# Patient Record
Sex: Male | Born: 1985 | Marital: Single
Health system: Midwestern US, Community
[De-identification: ages and names within clinical notes are randomized; demographics above are authoritative.]

## PROBLEM LIST (undated history)

## (undated) DIAGNOSIS — R569 Unspecified convulsions: Secondary | ICD-10-CM

## (undated) DIAGNOSIS — I10 Essential (primary) hypertension: Secondary | ICD-10-CM

## (undated) DIAGNOSIS — F419 Anxiety disorder, unspecified: Secondary | ICD-10-CM

## (undated) DIAGNOSIS — L308 Other specified dermatitis: Secondary | ICD-10-CM

## (undated) DIAGNOSIS — L732 Hidradenitis suppurativa: Secondary | ICD-10-CM

## (undated) DIAGNOSIS — E119 Type 2 diabetes mellitus without complications: Secondary | ICD-10-CM

## (undated) HISTORY — DX: Anxiety disorder, unspecified: F41.9

## (undated) HISTORY — PX: TONSILLECTOMY: SUR1361

---

## 2012-09-19 ENCOUNTER — Encounter (HOSPITAL_COMMUNITY): Payer: Self-pay | Admitting: *Deleted

## 2012-09-19 ENCOUNTER — Emergency Department (HOSPITAL_COMMUNITY)
Admission: EM | Admit: 2012-09-19 | Discharge: 2012-09-19 | Disposition: A | Discharge status: Home / Self Care | Payer: BC Managed Care – PPO | Source: Home / Self Care | Attending: Family Medicine | Admitting: Family Medicine

## 2012-09-19 DIAGNOSIS — S161XXA Strain of muscle, fascia and tendon at neck level, initial encounter: Secondary | ICD-10-CM

## 2012-09-19 DIAGNOSIS — S139XXA Sprain of joints and ligaments of unspecified parts of neck, initial encounter: Secondary | ICD-10-CM

## 2012-09-19 MED ORDER — KETOROLAC TROMETHAMINE 10 MG PO TABS: 10.0000 mg | ORAL_TABLET | Freq: Four times a day (QID) | ORAL | Status: DC | PRN

## 2012-09-19 MED ORDER — CYCLOBENZAPRINE HCL 5 MG PO TABS: 5.0000 mg | ORAL_TABLET | Freq: Three times a day (TID) | ORAL | Status: DC | PRN

## 2012-09-19 NOTE — ED Provider Notes (Signed)
History     CSN: [id number]  Arrival date & time 09/19/12  1656   First MD Initiated Contact with Patient 09/19/12 1721      No chief complaint on file.   (Consider location/radiation/quality/duration/timing/severity/associated sxs/prior treatment) Patient is a 27 y.o. male presenting with neck injury. The history is provided by the patient.  Neck Injury This is a new problem. The current episode started 2 days ago (pulling on neck in anger and felt pop and then spasm has developed.). The problem has been gradually worsening. Pertinent negatives include no chest pain and no abdominal pain. The symptoms are aggravated by twisting.    No past medical history on file.  No past surgical history on file.  No family history on file.  History  Substance Use Topics  . Smoking status: Not on file  . Smokeless tobacco: Not on file  . Alcohol Use: Not on file      Review of Systems  Constitutional: Negative.   HENT: Positive for neck pain and neck stiffness.   Cardiovascular: Negative for chest pain.  Gastrointestinal: Negative for abdominal pain.    Allergies  Review of patient's allergies indicates not on file.  Home Medications   Current Outpatient Rx  Name  Route  Sig  Dispense  Refill  . cyclobenzaprine (FLEXERIL) 5 MG tablet   Oral   Take 1 tablet (5 mg total) by mouth 3 (three) times daily as needed for muscle spasms.   30 tablet   0   . ketorolac (TORADOL) 10 MG tablet   Oral   Take 1 tablet (10 mg total) by mouth every 6 (six) hours as needed for pain.   20 tablet   0     BP 145/85  Pulse 64  Temp(Src) 98.8 F (37.1 C) (Oral)  Resp 16  SpO2 100%  Physical Exam  Nursing note and vitals reviewed. Constitutional: He is oriented to person, place, and time. He appears well-developed and well-nourished. He appears distressed.  HENT:  Head: Normocephalic and atraumatic.  Right Ear: External ear normal.  Left Ear: External ear normal.  Mouth/Throat:  Oropharynx is clear and moist.  Neck: Muscular tenderness present. No spinous process tenderness present. Rigidity present. Decreased range of motion present.  Musculoskeletal: He exhibits tenderness.       Cervical back: He exhibits decreased range of motion, tenderness, pain and spasm. He exhibits no bony tenderness, no swelling and no edema.  Nl upper and lower ext motor and sens fxn.  Lymphadenopathy:    He has no cervical adenopathy.  Neurological: He is alert and oriented to person, place, and time. He has normal reflexes. He displays normal reflexes. No cranial nerve deficit.  Skin: Skin is warm and dry.    ED Course  Procedures (including critical care time)  Labs Reviewed - No data to display No results found.   1. Cervical strain, acute, initial encounter       MDM          Linna Hoff, MD 09/19/12 [phone]

## 2012-09-19 NOTE — ED Notes (Signed)
States he was frustrated and had his hands behind his head. He pushed back with head and forward with his arms and felt something pull in his neck on Wednesday.  It felt like a strain.

## 2014-02-17 ENCOUNTER — Encounter: Payer: Self-pay | Admitting: Family Medicine

## 2014-12-16 ENCOUNTER — Emergency Department (HOSPITAL_COMMUNITY)
Admission: EM | Admit: 2014-12-16 | Discharge: 2014-12-16 | Disposition: A | Discharge status: Home / Self Care | Payer: Managed Care, Other (non HMO) | Attending: Emergency Medicine | Admitting: Emergency Medicine

## 2014-12-16 ENCOUNTER — Encounter (HOSPITAL_COMMUNITY): Payer: Self-pay

## 2014-12-16 ENCOUNTER — Emergency Department (HOSPITAL_COMMUNITY): Admission: EM | Admit: 2014-12-16 | Discharge: 2014-12-16 | Discharge status: Absent without leave | Payer: Self-pay

## 2014-12-16 DIAGNOSIS — Z87891 Personal history of nicotine dependence: Secondary | ICD-10-CM | POA: Diagnosis not present

## 2014-12-16 DIAGNOSIS — Z79899 Other long term (current) drug therapy: Secondary | ICD-10-CM | POA: Diagnosis not present

## 2014-12-16 DIAGNOSIS — R42 Dizziness and giddiness: Secondary | ICD-10-CM | POA: Diagnosis present

## 2014-12-16 DIAGNOSIS — Z791 Long term (current) use of non-steroidal anti-inflammatories (NSAID): Secondary | ICD-10-CM | POA: Insufficient documentation

## 2014-12-16 LAB — CBC WITH DIFFERENTIAL/PLATELET
BASOS ABS: 0 10*3/uL (ref 0.0–0.1)
Basophils Relative: 0 % (ref 0–1)
Eosinophils Absolute: 0.1 10*3/uL (ref 0.0–0.7)
Eosinophils Relative: 1 % (ref 0–5)
HCT: 39.9 % (ref 39.0–52.0)
Hemoglobin: 13.6 g/dL (ref 13.0–17.0)
LYMPHS ABS: 1.1 10*3/uL (ref 0.7–4.0)
LYMPHS PCT: 20 % (ref 12–46)
MCH: 29.8 pg (ref 26.0–34.0)
MCHC: 34.1 g/dL (ref 30.0–36.0)
MCV: 87.5 fL (ref 78.0–100.0)
Monocytes Absolute: 0.5 10*3/uL (ref 0.1–1.0)
Monocytes Relative: 9 % (ref 3–12)
NEUTROS ABS: 3.8 10*3/uL (ref 1.7–7.7)
NEUTROS PCT: 70 % (ref 43–77)
PLATELETS: 296 10*3/uL (ref 150–400)
RBC: 4.56 MIL/uL (ref 4.22–5.81)
RDW: 12.8 % (ref 11.5–15.5)
WBC: 5.4 10*3/uL (ref 4.0–10.5)

## 2014-12-16 LAB — URINALYSIS, ROUTINE W REFLEX MICROSCOPIC
Bilirubin Urine: NEGATIVE
GLUCOSE, UA: NEGATIVE mg/dL
HGB URINE DIPSTICK: NEGATIVE
Ketones, ur: NEGATIVE mg/dL
Leukocytes, UA: NEGATIVE
Nitrite: NEGATIVE
PH: 6.5 (ref 5.0–8.0)
Protein, ur: NEGATIVE mg/dL
SPECIFIC GRAVITY, URINE: 1.006 (ref 1.005–1.030)
UROBILINOGEN UA: 0.2 mg/dL (ref 0.0–1.0)

## 2014-12-16 LAB — BASIC METABOLIC PANEL
Anion gap: 4 — ABNORMAL LOW (ref 5–15)
BUN: 13 mg/dL (ref 6–20)
CALCIUM: 9.2 mg/dL (ref 8.9–10.3)
CO2: 25 mmol/L (ref 22–32)
Chloride: 106 mmol/L (ref 101–111)
Creatinine, Ser: 0.69 mg/dL (ref 0.61–1.24)
GFR calc Af Amer: >60 mL/min (ref >60)
GFR calc non Af Amer: >60 mL/min (ref >60)
GLUCOSE: 93 mg/dL (ref 65–99)
Potassium: 3.6 mmol/L (ref 3.5–5.1)
SODIUM: 135 mmol/L (ref 135–145)

## 2014-12-16 LAB — I-STAT TROPONIN, ED: TROPONIN I, POC: 0 ng/mL (ref 0.00–0.08)

## 2014-12-16 MED ORDER — SODIUM CHLORIDE 0.9 % IV BOLUS (SEPSIS)
1000.0000 mL | Freq: Once | INTRAVENOUS | Status: AC
Start: 2014-12-16 — End: 2014-12-16
  Administered 2014-12-16: 1000 mL via INTRAVENOUS

## 2014-12-16 MED ORDER — MECLIZINE HCL 25 MG PO TABS: 25.0000 mg | ORAL_TABLET | Freq: Three times a day (TID) | ORAL | Status: AC | PRN

## 2014-12-16 MED ORDER — MECLIZINE HCL 25 MG PO TABS
25.0000 mg | ORAL_TABLET | Freq: Once | ORAL | Status: AC
  Administered 2014-12-16: 25 mg via ORAL
  Filled 2014-12-16: qty 1

## 2014-12-16 NOTE — ED Notes (Signed)
MD at bedside. 

## 2014-12-16 NOTE — ED Provider Notes (Signed)
CSN: [id number]     Arrival date & time 12/16/14  1448 History   First MD Initiated Contact with Patient 12/16/14 1518     Chief Complaint  Patient presents with  . Dizziness     (Consider location/radiation/quality/duration/timing/severity/associated sxs/prior Treatment) Patient is a 29 y.o. male presenting with dizziness. The history is provided by the patient and medical records.  Dizziness   This is a 29 year old male with no significant past medical history presenting to the ED for dizziness.  Patient states he started a new medication, Pristiq, on Tuesday-- today was his 3rd dose, took at [facility] today which is slightly later than he has been taking it.  States he went home to eat lunch and took his wife some food and began feeling "off balance" and "in a fog".  Wife states he appeared more sluggish than normal and somewhat pale.  He states he went back to work but began feeling worse and was sent home.  He states he attempted to call his PCP but never got a call back.  He states he continues to feel somewhat "off".  States some dizziness, worse with moving his head and walking but better sitting still.  No hx of vertigo.  He denies any current chest pain or shortness of breath, states he did have some of this earlier in the week. He denies any headache, photophobia, confusion, changes in speech, numbness, weakness, or visual disturbance. He denies any recent illness, fevers, sweats, or chills. Patient does admit to recent increased stress-- he and wife are buying a new house and recently found out that they are expecting their first child. His work is also Audiological scientistchanging management.    History reviewed. No pertinent past medical history. Past Surgical History  Procedure Laterality Date  . Tonsillectomy     History reviewed. No pertinent family history. History  Substance Use Topics  . Smoking status: Former Smoker    Types: Cigarettes    Quit date: 04/07/2012  . Smokeless tobacco: Not on file   . Alcohol Use: 4.2 oz/week    7 Shots of liquor per week    Review of Systems  Neurological: Positive for dizziness.  All other systems reviewed and are negative.     Allergies  Review of patient's allergies indicates no known allergies.  Home Medications   Prior to Admission medications   Medication Sig Start Date End Date Taking? Authorizing Provider  Aspirin-Salicylamide-Caffeine (BC HEADACHE POWDER PO) Take by mouth.    Historical Provider, MD  cyclobenzaprine (FLEXERIL) 5 MG tablet Take 1 tablet (5 mg total) by mouth 3 (three) times daily as needed for muscle spasms. 09/19/12   Linna HoffJames D Kindl, MD  ibuprofen (ADVIL,MOTRIN) 200 MG tablet Take 800 mg by mouth every 6 (six) hours as needed for pain.    Historical Provider, MD  ketorolac (TORADOL) 10 MG tablet Take 1 tablet (10 mg total) by mouth every 6 (six) hours as needed for pain. 09/19/12   Linna HoffJames D Kindl, MD   BP 158/100 mmHg  Pulse 77  Temp(Src) 98.1 F (36.7 C) (Oral)  Resp 16  SpO2 100%   Physical Exam  Constitutional: He is oriented to person, place, and time. He appears well-developed and well-nourished. No distress.  HENT:  Head: Normocephalic and atraumatic.  Mouth/Throat: Oropharynx is clear and moist.  Eyes: Conjunctivae and EOM are normal. Pupils are equal, round, and reactive to light.  Neck: Normal range of motion. Neck supple.  Cardiovascular: Normal rate, regular rhythm and  normal heart sounds.   Pulmonary/Chest: Effort normal and breath sounds normal. No respiratory distress. He has no wheezes.  Abdominal: Soft. Bowel sounds are normal. There is no tenderness. There is no guarding.  Musculoskeletal: Normal range of motion.  Neurological: He is alert and oriented to person, place, and time.  AAOx3, answering questions appropriately; equal strength UE and LE bilaterally; CN grossly intact; moves all extremities appropriately without ataxia; no focal neuro deficits or facial asymmetry appreciated  Skin:  Skin is warm and dry. He is not diaphoretic.  Psychiatric: He has a normal mood and affect.  Nursing note and vitals reviewed.   ED Course  Procedures (including critical care time) Labs Review Labs Reviewed  BASIC METABOLIC PANEL - Abnormal; Notable for the following:    Anion gap 4 (*)    All other components within normal limits  URINALYSIS, ROUTINE W REFLEX MICROSCOPIC - Abnormal; Notable for the following:    APPearance CLOUDY (*)    All other components within normal limits  CBC WITH DIFFERENTIAL/PLATELET  I-STAT TROPOININ, ED    Imaging Review No results found.   EKG Interpretation   Date/Time:  Thursday Dec 16 2014 15:06:38 EDT Ventricular Rate:  75 PR Interval:  172 QRS Duration: 88 QT Interval:  376 QTC Calculation: 420 R Axis:   62 Text Interpretation:  Sinus rhythm Normal ECG No previous ECGs available  Confirmed by Bebe ShaggyWICKLINE  MD, Dorinda HillNALD ([id number]) on 12/16/2014 3:32:04 PM      MDM   Final diagnoses:  Dizziness    29 year old male here with dizziness  Onset today. He recently started Pristiq, today was his third dose. He also admits to significant increase in stress recently. Patient is afebrile and nontoxic in appearance on exam. His neurologic exam is non-focal. He does report that his symptoms are worse with movement and ambulation. He has no history of vertigo, however I suspect his symptoms are vertiginous in nature which may be secondary to new medications versus some stress and anxiety. His lab work is reassuring. EKG without acute ischemic changes. Patient was given IV fluids and meclizine with significant improvement of his symptoms. He remains neurologically intact. Patient will be discharged home and started to follow-up with his PCP to discuss medications and potential side effects.  Discussed plan with patient, he/she acknowledged understanding and agreed with plan of care.  Return precautions given for new or worsening symptoms.  Garlon HatchetLisa M Sanders,  PA-C 12/16/14 1736  Zadie Rhineonald Wickline, MD 12/17/14 [phone]

## 2014-12-16 NOTE — Discharge Instructions (Signed)
Take the prescribed medication as directed if needed for nausea.  Make sure to eat regularly and drink fluids. Follow-up with Dr. Cyndia BentBadger-- make sure to discuss Pristiq and side effects.  I have attached your labs on back for his review. Return to the ED for new or worsening symptoms.

## 2014-12-16 NOTE — ED Notes (Signed)
Pt has started new medication.  Pristiq.  Pt has taken 3 doses.  Tues, wed and 1 today.  Today he took med late at 4312.  Within 45 min pt states he felt "hazy", "tired". Pt called MD but did not get return call.  No n/v.  No chest pain.

## 2014-12-16 NOTE — ED Notes (Signed)
EKG completed in triage.

## 2017-02-27 ENCOUNTER — Encounter (HOSPITAL_COMMUNITY): Payer: Self-pay | Admitting: Emergency Medicine

## 2017-02-27 ENCOUNTER — Emergency Department (HOSPITAL_COMMUNITY): Payer: 59

## 2017-02-27 ENCOUNTER — Emergency Department (HOSPITAL_COMMUNITY)
Admission: EM | Admit: 2017-02-27 | Discharge: 2017-02-27 | Disposition: A | Discharge status: Home / Self Care | Payer: 59 | Attending: Physician Assistant | Admitting: Physician Assistant

## 2017-02-27 DIAGNOSIS — Z79899 Other long term (current) drug therapy: Secondary | ICD-10-CM | POA: Diagnosis not present

## 2017-02-27 DIAGNOSIS — Z87891 Personal history of nicotine dependence: Secondary | ICD-10-CM | POA: Insufficient documentation

## 2017-02-27 DIAGNOSIS — F13939 Sedative, hypnotic or anxiolytic use, unspecified with withdrawal, unspecified: Secondary | ICD-10-CM | POA: Insufficient documentation

## 2017-02-27 DIAGNOSIS — F13239 Sedative, hypnotic or anxiolytic dependence with withdrawal, unspecified: Secondary | ICD-10-CM

## 2017-02-27 DIAGNOSIS — R569 Unspecified convulsions: Secondary | ICD-10-CM | POA: Insufficient documentation

## 2017-02-27 LAB — CBG MONITORING, ED: GLUCOSE-CAPILLARY: 82 mg/dL (ref 65–99)

## 2017-02-27 LAB — BASIC METABOLIC PANEL
Anion gap: 11 (ref 5–15)
BUN: 14 mg/dL (ref 6–20)
CHLORIDE: 105 mmol/L (ref 101–111)
CO2: 21 mmol/L — ABNORMAL LOW (ref 22–32)
Calcium: 9.8 mg/dL (ref 8.9–10.3)
Creatinine, Ser: 0.94 mg/dL (ref 0.61–1.24)
Glucose, Bld: 87 mg/dL (ref 65–99)
Potassium: 5.2 mmol/L — ABNORMAL HIGH (ref 3.5–5.1)
SODIUM: 137 mmol/L (ref 135–145)

## 2017-02-27 LAB — CBC
HEMATOCRIT: 40.2 % (ref 39.0–52.0)
Hemoglobin: 13.7 g/dL (ref 13.0–17.0)
MCH: 29.6 pg (ref 26.0–34.0)
MCHC: 34.1 g/dL (ref 30.0–36.0)
MCV: 86.8 fL (ref 78.0–100.0)
PLATELETS: 330 10*3/uL (ref 150–400)
RBC: 4.63 MIL/uL (ref 4.22–5.81)
RDW: 13.2 % (ref 11.5–15.5)
WBC: 13.4 10*3/uL — AB (ref 4.0–10.5)

## 2017-02-27 MED ORDER — LORAZEPAM 2 MG/ML IJ SOLN
1.0000 mg | Freq: Once | INTRAMUSCULAR | Status: AC
  Administered 2017-02-27: 1 mg via INTRAVENOUS
  Filled 2017-02-27: qty 1

## 2017-02-27 MED ORDER — LORAZEPAM 1 MG PO TABS: ORAL_TABLET | ORAL | 0 refills | Status: AC

## 2017-02-27 MED ORDER — KETOROLAC TROMETHAMINE 30 MG/ML IJ SOLN
15.0000 mg | Freq: Once | INTRAMUSCULAR | Status: AC
  Administered 2017-02-27: 15 mg via INTRAMUSCULAR
  Filled 2017-02-27: qty 1

## 2017-02-27 NOTE — ED Notes (Signed)
Patient transported to CT 

## 2017-02-27 NOTE — ED Notes (Signed)
Pt states he has been out of his Xanax X2 days. States takes daily up to TID.
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
## 2017-02-27 NOTE — Discharge Instructions (Signed)
Please read instructions below. Begin taking the Ativan in a tapered fashion as follows:  - 2 mg every 6 hours, for 4 doses, then  - 1 mg every 6 hours, for 4 doses, then  - 1 mg every 12 hours, for 4 doses. It is important that you follow-up with your primary care provider about your visit today. I have provided a neurology referral as needed. Return to the ER for seizure-like activity, or new or concerning symptoms.
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
## 2017-03-04 ENCOUNTER — Ambulatory Visit (INDEPENDENT_AMBULATORY_CARE_PROVIDER_SITE_OTHER): Payer: 59 | Admitting: Neurology

## 2017-03-04 ENCOUNTER — Encounter: Payer: Self-pay | Admitting: Neurology

## 2017-03-04 VITALS — BP 150/87 | HR 88 | Ht 68.0 in | Wt 203.0 lb

## 2017-03-04 DIAGNOSIS — R569 Unspecified convulsions: Secondary | ICD-10-CM

## 2017-03-04 NOTE — Patient Instructions (Signed)
Remember to drink plenty of fluid, eat healthy meals and do not skip any meals. Try to eat protein with a every meal and eat a healthy snack such as fruit or nuts in between meals. Try to keep a regular sleep-wake schedule and try to exercise daily, particularly in the form of walking, 20-30 minutes a day, if you can.   As far as diagnostic testing: MRi brain, EEG in the office then an extended EEG  I would like to see you back in 4 months, sooner if we need to. Please call us with any interim questions, concerns, problems, updates or refill requests.   Our phone number is [phone]. We also have an after hours call service for urgent matters and there is a physician on-call for urgent questions. For any emergencies you know to call 911 or go to the nearest emergency room   Seizure, Adult A seizure is a sudden burst of abnormal electrical activity in the brain. The abnormal activity temporarily interrupts normal brain function, causing a person to experience any of the following:  Involuntary movements.  Changes in awareness or consciousness.  Uncontrollable shaking (convulsions).  Seizures usually last from 30 seconds to 2 minutes. They usually do not cause permanent brain damage unless they are prolonged. What can cause a seizure to happen? Seizures can happen for many reasons including:  A fever.  Low blood sugar.  A medicine.  An illnesses.  A brain injury.  Some people who have a seizure never have another one. People who have repeated seizures have a condition called epilepsy. What are the symptoms of a seizure? Symptoms of a seizure vary greatly from person to person. They include:  Convulsions.  Stiffening of the body.  Involuntary movements of the arms or legs.  Loss of consciousness.  Breathing problems.  Falling suddenly.  Confusion.  Head nodding.  Eye blinking or fluttering.  Lip smacking.  Drooling.  Rapid eye movements.  Grunting.  Loss  of bladder control and bowel control.  Staring.  Unresponsiveness.  Some people have symptoms right before a seizure happens (aura) and right after a seizure happens. Symptoms of an aura include:  Fear or anxiety.  Nausea.  Feeling like the room is spinning (vertigo).  A feeling of having seen or heard something before (deja vu).  Odd tastes or smells.  Changes in vision, such as seeing flashing lights or spots.  Symptoms that may follow a seizure include:  Confusion.  Sleepiness.  Headache.  Weakness of one side of the body.  Follow these instructions at home: Medicines   Take over-the-counter and prescription medicines only as told by your health care provider.  Avoid any substances that may prevent your medicine from working properly, such as alcohol. Activity  Do not drive, swim, or do any other activities that would be dangerous if you had another seizure. Wait until your health care provider approves.  If you live in the [place] check with your local DMV (department of motor vehicles) to find out about the local driving laws. Each state has specific rules about when you can legally return to driving.  Get enough rest. Lack of sleep can make seizures more likely to occur. Educating others Teach friends and family what to do if you have a seizure. They should:  Lay you on the ground to prevent a fall.  Cushion your head and body.  Loosen any tight clothing around your neck.  Turn you on your side. If vomiting occurs, this helps  keep your airway clear.  Stay with you until you recover.  Not hold you down. Holding you down will not stop the seizure.  Not put anything in your mouth.  Know whether or not you need emergency care.  General instructions  Contact your health care provider each time you have a seizure.  Avoid anything that has ever triggered a seizure for you.  Keep a seizure diary. Record what you remember about each seizure, especially  anything that might have triggered the seizure.  Keep all follow-up visits as told by your health care provider. This is important. Contact a health care provider if:  You have another seizure.  You have seizures more often.  Your seizure symptoms change.  You continue to have seizures with treatment.  You have symptoms of an infection or illness. They might increase your risk of having a seizure. Get help right away if:  You have a seizure: ? That lasts longer than 5 minutes. ? That is different than previous seizures. ? That leaves you unable to speak or use a part of your body. ? That makes it harder to breathe. ? After a head injury.  You have: ? Multiple seizures in a row. ? Confusion or a severe headache right after a seizure.  You are having seizures more often.  You do not wake up immediately after a seizure.  You injure yourself during a seizure. These symptoms may represent a serious problem that is an emergency. Do not wait to see if the symptoms will go away. Get medical help right away. Call your local emergency services (911 in the [place] Do not drive yourself to the hospital. This information is not intended to replace advice given to you by your health care provider. Make sure you discuss any questions you have with your health care provider. Document Released: 07/20/2000 Document Revised: 03/18/2016 Document Reviewed: 02/24/2016 Elsevier Interactive Patient Education  2017 [employer]

## 2017-03-04 NOTE — Progress Notes (Signed)
[facility]    Provider:  Dr Lucia GaskinsAhern Referring Provider: Eartha InchBadger, Michael C, MD Primary Care Physician:  Eartha InchBadger, Michael C, MD  CC:  Seizure-like activity  HPI:  Antonio Cardenas is a 31 y.o. male here as a referral from Dr. Cyndia BentBadger for seizure-like activity in the setting of possible benzodiazepine withdrawal and illness. He had gone to the doctor and diagnosed with strep pharyngitis. He had a sore throat, a little fever and then he was driving and then the next thing he knew he was in the ambulance. Bystanders saw the car veer to the side of the road. % days ago, wife here and also provides information as well. He called wife and he was still incoherent from the ambulance. He bit his tongue. No urination. He had a possible seizure 10 years ago, he was drinking and using drugs for two days and hadn't slept in 48 hours and he woke up on a stretcher. In between these 2 episodes no other seizure-like events. He is still tired. His tongue still hurts. He continues to have some memory changes. He was on Xanax and now on ativan now as a taper. He had ran out of xanax for a day or two. He had an entire workup 10 years ago, eegs, MRIs and all were negative. Aunt with epilepsy. No other focal neurologic deficits, associated symptoms, inciting events or modifiable factors.  Reviewed notes, labs and imaging from outside physicians, which showed:  Patient was seen in the emergency room 02/27/2017 for possible seizure activity in the setting of benzodiazepine withdrawal and illness. He has a history of anxiety and takes Xanax routinely and ran out of his Xanax 2 days prior. Also developed sore throat, body aches and subjective fevers and chills. He was diagnosed with viral pharyngitis. On the way home he felt hot and dizzy, started driving off to the side of the road and the next thing he knew he was waking up to an ambulance. Bystanders told EMS they had seen patient rolling slowly off the side  of the road and stopped, he was sweaty and shaking, he had tongue biting, and he was briefly confused and round. He had one possible seizure over 10 years ago while he was in vagus endorse alcohol and drug use during that time. Diagnosis was potential benzodiazepine withdrawal seizure. Normal neurologic exam. EKG was normal.  CT head showed No acute intracranial abnormalities including mass lesion or mass effect, hydrocephalus, extra-axial fluid collection, midline shift, hemorrhage, or acute infarction, large ischemic events (personally reviewed images)  BMP was unremarkable, CBC showed elevated white blood cells is 13.4.   Review of Systems: Patient complains of symptoms per HPI as well as the following symptoms: Fevers chills, fatigue, blurred vision, cough, snoring, feeling hot, feeling cold, spinning sensation, memory loss, confusion, headache, numbness, slurred speech, dizziness, seizures, depression, anxiety, disinterest and activities, decreased energy, sleepiness. Pertinent negatives and positives per HPI. All others negative.   Social History   Social History  . Marital status: Single    Spouse name: N/A  . Number of children: N/A  . Years of education: N/A   Occupational History  . Not on file.   Social History Main Topics  . Smoking status: Former Smoker    Types: Cigarettes    Quit date: 04/07/2012  . Smokeless tobacco: Current User  . Alcohol use 4.2 oz/week    7 Shots of liquor per week  . Drug use: No  . Sexual activity: Yes  Birth control/ protection: Condom   Other Topics Concern  . Not on file   Social History Narrative  . No narrative on file    History reviewed. No pertinent family history.  Past Medical History:  Diagnosis Date  . Anxiety     Past Surgical History:  Procedure Laterality Date  . TONSILLECTOMY      Current Outpatient Prescriptions  Medication Sig Dispense Refill  . ALPRAZolam (XANAX) 0.5 MG tablet Take 0.5 mg by mouth at  bedtime as needed for anxiety.    . Artificial Tear Ointment (DRY EYES OP) Place 1 drop into both eyes as needed (dry eye).    . Aspirin-Salicylamide-Caffeine (BC HEADACHE POWDER PO) Take 1 tablet by mouth daily as needed.     . cyclobenzaprine (FLEXERIL) 5 MG tablet Take 1 tablet (5 mg total) by mouth 3 (three) times daily as needed for muscle spasms. 30 tablet 0  . ibuprofen (ADVIL,MOTRIN) 200 MG tablet Take 800 mg by mouth every 6 (six) hours as needed for pain.    Marland Kitchen lisinopril (PRINIVIL,ZESTRIL) 20 MG tablet Take 20 mg by mouth daily.    Marland Kitchen LORazepam (ATIVAN) 1 MG tablet Take 2 mg every 6 hours for 4 doses, then  Take 1 mg every 6 hours for 4 doses, then  Take 1 mg every 12 hours for 4 doses 16 tablet 0  . meclizine (ANTIVERT) 25 MG tablet Take 1 tablet (25 mg total) by mouth 3 (three) times daily as needed for dizziness. 30 tablet 0  . Multiple Vitamin (MULTIVITAMIN) tablet Take 1 tablet by mouth daily.    . rosuvastatin (CRESTOR) 20 MG tablet Take 20 mg by mouth daily.    Marland Kitchen venlafaxine XR (EFFEXOR-XR) 75 MG 24 hr capsule Take 75 mg by mouth 3 (three) times daily.     No current facility-administered medications for this visit.     Allergies as of 03/04/2017  . (No Known Allergies)    Vitals: BP (!) 150/87   Pulse 88   Ht 5\' 8"  (1.727 m)   Wt 203 lb (92.1 kg)   BMI 30.87 kg/m  Last Weight:  Wt Readings from Last 1 Encounters:  03/04/17 203 lb (92.1 kg)   Last Height:   Ht Readings from Last 1 Encounters:  03/04/17 5\' 8"  (1.727 m)   Physical exam: Exam: Gen: NAD, conversant, well nourised, obese, well groomed                     CV: RRR, no MRG. No Carotid Bruits. No peripheral edema, warm, nontender Eyes: Conjunctivae clear without exudates or hemorrhage  Neuro: Detailed Neurologic Exam  Speech:    Speech is normal; fluent and spontaneous with normal comprehension.  Cognition:    The patient is oriented to person, place, and time;     recent and remote memory  intact;     language fluent;     normal attention, concentration,     fund of knowledge Cranial Nerves:    The pupils are equal, round, and reactive to light. The fundi are normal and spontaneous venous pulsations are present. Visual fields are full to finger confrontation. Extraocular movements are intact. Trigeminal sensation is intact and the muscles of mastication are normal. The face is symmetric. The palate elevates in the midline. Hearing intact. Voice is normal. Shoulder shrug is normal. The tongue has normal motion without fasciculations.   Coordination:    Normal finger to nose and heel to shin. Normal rapid  alternating movements.   Gait:    Heel-toe and tandem gait are normal.   Motor Observation:    No asymmetry, no atrophy, and no involuntary movements noted. Tone:    Normal muscle tone.    Posture:    Posture is normal. normal erect    Strength:    Strength is V/V in the upper and lower limbs.      Sensation: intact to LT     Reflex Exam:  DTR's:    Deep tendon reflexes in the upper and lower extremities are normal bilaterally.   Toes:    The toes are downgoing bilaterally.   Clonus:    Clonus is absent.       Assessment/Plan:  31 year old male with a past medical history of anxiety and depression here for seizure in the setting of illness as well as possible benzodiazepine withdrawal. Need a full seizure workup including MRI of the brain with and without contrast, routine EEG and then extended EEG if clinically warranted. Is is likely a provoked seizure but need to rule out underlying seizure disorder.  Discussed: Patient is unable to drive, operate heavy machinery, perform activities at heights or participate in water activities until 6 months seizure free.  Discussed Patients with epilepsy have a small risk of sudden unexpected death, a condition referred to as sudden unexpected death in epilepsy (SUDEP). SUDEP is defined specifically as the sudden,  unexpected, witnessed or unwitnessed, nontraumatic and nondrowning death in patients with epilepsy with or without evidence for a seizure, and excluding documented status epilepticus, in which post mortem examination does not reveal a structural or toxicologic cause for death   Cc: Dr. Adonis HugueninBadger  Antonia Ahern, MD  [facility] [address] [place], [place] [id number]  Phone [phone] Fax [phone]

## 2017-03-06 ENCOUNTER — Emergency Department (HOSPITAL_COMMUNITY): Payer: 59

## 2017-03-06 ENCOUNTER — Encounter (HOSPITAL_COMMUNITY): Payer: Self-pay

## 2017-03-06 ENCOUNTER — Emergency Department (HOSPITAL_COMMUNITY)
Admission: EM | Admit: 2017-03-06 | Discharge: 2017-03-07 | Disposition: A | Discharge status: Home / Self Care | Payer: 59 | Attending: Emergency Medicine | Admitting: Emergency Medicine

## 2017-03-06 DIAGNOSIS — I10 Essential (primary) hypertension: Secondary | ICD-10-CM | POA: Diagnosis not present

## 2017-03-06 DIAGNOSIS — Z87891 Personal history of nicotine dependence: Secondary | ICD-10-CM | POA: Diagnosis not present

## 2017-03-06 DIAGNOSIS — Z79899 Other long term (current) drug therapy: Secondary | ICD-10-CM | POA: Diagnosis not present

## 2017-03-06 DIAGNOSIS — F13930 Sedative, hypnotic or anxiolytic use, unspecified with withdrawal, uncomplicated: Secondary | ICD-10-CM

## 2017-03-06 DIAGNOSIS — F13230 Sedative, hypnotic or anxiolytic dependence with withdrawal, uncomplicated: Secondary | ICD-10-CM | POA: Insufficient documentation

## 2017-03-06 DIAGNOSIS — R569 Unspecified convulsions: Secondary | ICD-10-CM | POA: Insufficient documentation

## 2017-03-06 HISTORY — DX: Essential (primary) hypertension: I10

## 2017-03-06 HISTORY — DX: Unspecified convulsions: R56.9

## 2017-03-06 LAB — BASIC METABOLIC PANEL
ANION GAP: 8 (ref 5–15)
BUN: 15 mg/dL (ref 6–20)
CALCIUM: 8.6 mg/dL — AB (ref 8.9–10.3)
CO2: 21 mmol/L — ABNORMAL LOW (ref 22–32)
CREATININE: 0.78 mg/dL (ref 0.61–1.24)
Chloride: 109 mmol/L (ref 101–111)
GLUCOSE: 112 mg/dL — AB (ref 65–99)
Potassium: 3.8 mmol/L (ref 3.5–5.1)
Sodium: 138 mmol/L (ref 135–145)

## 2017-03-06 LAB — CBC WITH DIFFERENTIAL/PLATELET
BASOS ABS: 0 10*3/uL (ref 0.0–0.1)
BASOS PCT: 0 %
EOS PCT: 1 %
Eosinophils Absolute: 0.1 10*3/uL (ref 0.0–0.7)
HCT: 33.1 % — ABNORMAL LOW (ref 39.0–52.0)
Hemoglobin: 11.6 g/dL — ABNORMAL LOW (ref 13.0–17.0)
Lymphocytes Relative: 10 %
Lymphs Abs: 1.5 10*3/uL (ref 0.7–4.0)
MCH: 30.5 pg (ref 26.0–34.0)
MCHC: 35 g/dL (ref 30.0–36.0)
MCV: 87.1 fL (ref 78.0–100.0)
MONO ABS: 1.1 10*3/uL — AB (ref 0.1–1.0)
Monocytes Relative: 7 %
Neutro Abs: 12.3 10*3/uL — ABNORMAL HIGH (ref 1.7–7.7)
Neutrophils Relative %: 82 %
PLATELETS: 341 10*3/uL (ref 150–400)
RBC: 3.8 MIL/uL — ABNORMAL LOW (ref 4.22–5.81)
RDW: 13.1 % (ref 11.5–15.5)
WBC: 15 10*3/uL — ABNORMAL HIGH (ref 4.0–10.5)

## 2017-03-06 MED ORDER — LORAZEPAM 1 MG PO TABS: 1.0000 mg | ORAL_TABLET | ORAL | 0 refills | Status: AC

## 2017-03-06 MED ORDER — LORAZEPAM 2 MG/ML IJ SOLN
1.0000 mg | Freq: Once | INTRAMUSCULAR | Status: AC
  Administered 2017-03-06: 1 mg via INTRAVENOUS
  Filled 2017-03-06: qty 1

## 2017-03-06 MED ORDER — KETOROLAC TROMETHAMINE 30 MG/ML IJ SOLN
30.0000 mg | Freq: Once | INTRAMUSCULAR | Status: AC
  Administered 2017-03-06: 30 mg via INTRAVENOUS
  Filled 2017-03-06: qty 1

## 2017-03-06 MED ORDER — SODIUM CHLORIDE 0.9 % IV BOLUS (SEPSIS)
1000.0000 mL | Freq: Once | INTRAVENOUS | Status: AC
  Administered 2017-03-06: 1000 mL via INTRAVENOUS

## 2017-03-06 NOTE — ED Triage Notes (Signed)
Patient had seizure 45 minutes ago; seen here last seek for same. Does not take medication for seizure disorder.

## 2017-03-06 NOTE — ED Provider Notes (Signed)
[facility]-EMERGENCY DEPT Provider Note   CSN: [id number] Arrival date & time: 03/06/17  2119     History   Chief Complaint Chief Complaint  Patient presents with  . Seizures    HPI Antonio Cardenas is a 31 y.o. male.  HPI 31 year old male who presents with seizure. He has a history of hypertension and anxiety. Had a witnessed seizure for 45 seconds by his wife today while he was sitting on the couch. She states that he heard him cry out from the other room and came in and saw that his eyes were rolled backwards and he had generalized shaking. EMS was called and on their arrival he was postictal, for about 7 minutes. I he was seen in the emergency department July 25 for a seizure at that time he was taking Xanax routinely, and ran out of his Xanax. It was thought that maybe his seizures were due to benzodiazepine withdrawal. He was discharged with Ativan taper which she finished 2 days ago. States that he otherwise has felt as if he has been in his usual state of health. He did follow-up with the neurologist as an outpatient and scheduled for an upcoming MRI and EEG. State he felt weird today, but no nausea, vomiting, tremulousness. No focal numbness/weakness, vision or speech changes, or headaches.   Past Medical History:  Diagnosis Date  . Anxiety   . Hypertension   . Seizures (HCC)     There are no active problems to display for this patient.   Past Surgical History:  Procedure Laterality Date  . TONSILLECTOMY         Home Medications    Prior to Admission medications   Medication Sig Start Date End Date Taking? Authorizing Provider  ALPRAZolam Prudy Feeler(XANAX) 0.5 MG tablet Take 0.5 mg by mouth at bedtime as needed for anxiety.    [provider]  Artificial Tear Ointment (DRY EYES OP) Place 1 drop into both eyes as needed (dry eye).    [provider]  Aspirin-Salicylamide-Caffeine (BC HEADACHE POWDER PO) Take 1 tablet by mouth daily as needed.     [provider]  cyclobenzaprine (FLEXERIL) 5 MG tablet Take 1 tablet (5 mg total) by mouth 3 (three) times daily as needed for muscle spasms. 09/19/12   Linna HoffKindl, James D, MD  ibuprofen (ADVIL,MOTRIN) 200 MG tablet Take 800 mg by mouth every 6 (six) hours as needed for pain.    [provider]  lisinopril (PRINIVIL,ZESTRIL) 20 MG tablet Take 20 mg by mouth daily. 02/11/17   [provider]  LORazepam (ATIVAN) 1 MG tablet Take 2 mg every 6 hours for 4 doses, then  Take 1 mg every 6 hours for 4 doses, then  Take 1 mg every 12 hours for 4 doses 02/27/17   Russo, Swazil[place]Jordan N, PA-C  LORazepam (ATIVAN) 1 MG tablet Take 1 tablet (1 mg total) by mouth See admin instructions. Take 1 tablet by mouth four times a day for 7 days. Then take 1 mg by mouth three times a day for 7 days. Then take 1 mg by mouth two times a day for 7 days. Then take 0.5 mg two times a day for 7 days. Then take 0.5 mg once daily for 7 days 03/06/17   Lavera GuiseLiu, Darryle Dennie Duo, MD  meclizine (ANTIVERT) 25 MG tablet Take 1 tablet (25 mg total) by mouth 3 (three) times daily as needed for dizziness. 12/16/14   Garlon HatchetSanders, Lisa M, PA-C  Multiple Vitamin (MULTIVITAMIN) tablet Take  1 tablet by mouth daily.    [provider]  rosuvastatin (CRESTOR) 20 MG tablet Take 20 mg by mouth daily. 02/06/17   [provider]  venlafaxine XR (EFFEXOR-XR) 75 MG 24 hr capsule Take 75 mg by mouth 3 (three) times daily. 02/06/17   [provider]    Family History No family history on file.  Social History Social History  Substance Use Topics  . Smoking status: Former Smoker    Types: Cigarettes    Quit date: 04/07/2012  . Smokeless tobacco: Current User  . Alcohol use 4.2 oz/week    7 Shots of liquor per week     Allergies   Patient has no known allergies.   Review of Systems Review of Systems  Constitutional: Negative for fever.  Respiratory: Negative for cough and shortness of breath.   Cardiovascular: Negative for chest  pain.  Gastrointestinal: Negative for abdominal pain.  Neurological: Positive for seizures.  All other systems reviewed and are negative.    Physical Exam Updated Vital Signs BP (!) 136/91   Pulse 88   Temp 100.3 F (37.9 C)   Resp (!) 21   Ht 5\' 9"  (1.753 m)   Wt 90.7 kg (200 lb)   SpO2 98%   BMI 29.53 kg/m   Physical Exam Physical Exam  Nursing note and vitals reviewed. Constitutional: Well developed, well nourished, non-toxic, and in no acute distress Head: Normocephalic and atraumatic.  Mouth/Throat: Oropharynx is clear and moist.  Neck: Normal range of motion. Neck supple. no nuchal rigidity. Cardiovascular: Normal rate and regular rhythm.   Pulmonary/Chest: Effort normal and breath sounds normal.  Abdominal: Soft. There is no tenderness. There is no rebound and no guarding.  Musculoskeletal: Normal range of motion.  Skin: Skin is warm and dry.  Psychiatric: Cooperative Neurological:  Alert, oriented to person, place, time, and situation. Memory grossly in tact. Fluent speech. No dysarthria or aphasia.  Cranial nerves:  Pupils are symmetric, and reactive to light. EOMI without nystagmus. No gaze deviation. Facial muscles symmetric with activation. Sensation to light touch over face in tact bilaterally. Hearing grossly in tact. Palate elevates symmetrically. Head turn and shoulder shrug are intact. Tongue midline.  Reflexes defered.  Muscle bulk and tone normal. No pronator drift. Moves all extremities symmetrically. Sensation to light touch is in tact throughout in bilateral upper and lower extremities. Coordination reveals no dysmetria with finger to nose.    ED Treatments / Results  Labs (all labs ordered are listed, but only abnormal results are displayed) Labs Reviewed  CBC WITH DIFFERENTIAL/PLATELET - Abnormal; Notable for the following:       Result Value   WBC 15.0 (*)    RBC 3.80 (*)    Hemoglobin 11.6 (*)    HCT 33.1 (*)    Neutro Abs 12.3 (*)     Monocytes Absolute 1.1 (*)    All other components within normal limits  BASIC METABOLIC PANEL - Abnormal; Notable for the following:    CO2 21 (*)    Glucose, Bld 112 (*)    Calcium 8.6 (*)    All other components within normal limits    EKG  EKG Interpretation  Date/Time:  Wednesday March 06 2017 21:26:06 EDT Ventricular Rate:  93 PR Interval:    QRS Duration: 88 QT Interval:  351 QTC Calculation: 437 R Axis:   69 Text Interpretation:  Sinus rhythm no acute changes  Confirmed by Crista Curb [phone]) on 03/06/2017 9:57:26 PM  Radiology Dg Chest 2 View  Result Date: 03/06/2017 CLINICAL DATA:  31 year old male with seizures and upper back pain. Low-grade fever. EXAM: CHEST  2 VIEW COMPARISON:  None. FINDINGS: The heart size and mediastinal contours are within normal limits. Both lungs are clear. The visualized skeletal structures are unremarkable. IMPRESSION: No active cardiopulmonary disease. Electronically Signed   By: Elgie CollardArash  Radparvar M.D.   On: 03/06/2017 23:18    Procedures Procedures (including critical care time)  Medications Ordered in ED Medications  sodium chloride 0.9 % bolus 1,000 mL (0 mLs Intravenous Stopped 03/06/17 2305)  ketorolac (TORADOL) 30 MG/ML injection 30 mg (30 mg Intravenous Given 03/06/17 2148)  LORazepam (ATIVAN) injection 1 mg (1 mg Intravenous Given 03/06/17 2302)     Initial Impression / Assessment and Plan / ED Course  I have reviewed the triage vital signs and the nursing notes.  Pertinent labs & imaging results that were available during my care of the patient were reviewed by me and considered in my medical decision making (see chart for details).    Patient recently seen for seizure that was felt to be related to benzodiazepine withdrawal. Had recurrent seizure today, two days after finishing one week taper of ativan. Spoke with Dr. Amada JupiterKirkpatrick form neurology. Given he was on Xanax, he felt that seizure still from benzodiazepine withdrawal  and he needed a much longer taper to come off of the xanax. Recommended prescribing 1 month taper. Recommended close neurology follow-up.  Patient on my evaluation has mentation back to baseline. Grossly neuro in tact. He has low grade fever, but no infectious sympptoms.  He did have CT head one week prior that showed no intracranial processes. No headache, confusion, neuro complaints, nuchal rigidity. At this time do not suspect encephalitis or meningitis.   Patient to follow-up closely with neurologist. He has outpatient MRI and EEG scheduled within next 1-2 weeks. Strict return and follow-up instructions reviewed. He expressed understanding of all discharge instructions and felt comfortable with the plan of care.    Final Clinical Impressions(s) / ED Diagnoses   Final diagnoses:  Seizure (HCC)  Benzodiazepine withdrawal without complication [facility](HCC)    New Prescriptions Discharge Medication List as of 03/07/2017 12:02 AM    START taking these medications   Details  !! LORazepam (ATIVAN) 1 MG tablet Take 1 tablet (1 mg total) by mouth See admin instructions. Take 1 tablet by mouth four times a day for 7 days. Then take 1 mg by mouth three times a day for 7 days. Then take 1 mg by mouth two times a day for 7 days. Then take 0.5 mg two times a day for  7 days. Then take 0.5 mg once daily for 7 days, Starting Wed 03/06/2017, Print     !! - Potential duplicate medications found. Please discuss with provider.       Lavera GuiseLiu, Alexxia Stankiewicz Duo, MD 03/08/17 [phone]

## 2017-03-06 NOTE — Discharge Instructions (Signed)
Follow-up closely with your neurologist. You seizure may still be due to benzodiazepine withdrawal, and you are written a month taper. You may need longer one, but follow-up closely with your neurologist or PCP regarding this.  Return for worsening symptoms, including confusion, fevers, recurrent seizures, or any other symptoms concerning to you.

## 2017-03-07 ENCOUNTER — Telehealth: Payer: Self-pay | Admitting: Neurology

## 2017-03-07 ENCOUNTER — Telehealth: Payer: Self-pay | Admitting: *Deleted

## 2017-03-07 NOTE — Telephone Encounter (Signed)
Previous message @ 9:00am  Was not routed properly, this is a message for [employer] Jennifer

## 2017-03-07 NOTE — Telephone Encounter (Signed)
New pt seen in the office on 03/04/17 d/t ER visit on 02/27/17 d/t seizure-like activity in the setting of possible benzodiazepine withdrawal and illness. He has [facility] and [facility]. He returned to the ER yesterday evening again for seizure and back pain, EKG and CXR wnl. He was given IV Toradol and lorazepam and placed on a PO lorazepam taper upon discharge. CBC showed elevated WBC @ 15 (up from 13.4 at last ED visit and low RBC (3.8), hgb (11.6) and hct. (33.1).

## 2017-03-07 NOTE — Telephone Encounter (Signed)
Pt is requesting a call back, he states the hospital is placing him on about a 30 day taper on the LORazepam (ATIVAN) 1 MG tablet, pt wants a call back to know if Dr Lucia GaskinsAhern feels that is necessary.

## 2017-03-07 NOTE — Telephone Encounter (Signed)
Pharmacy called related to Rx: ATIVAN #64 .Marland Kitchen.Marland Kitchen.EDCM clarified with PHARM D COURTNEY to change Rx to: #74.

## 2017-03-08 NOTE — Telephone Encounter (Signed)
Tried calling back, mailbox is full. Will try again later or tomorrow

## 2017-03-10 ENCOUNTER — Other Ambulatory Visit: Payer: Self-pay | Admitting: Neurology

## 2017-03-10 MED ORDER — LEVETIRACETAM 500 MG PO TABS: 500.0000 mg | ORAL_TABLET | Freq: Two times a day (BID) | ORAL | 11 refills | Status: DC

## 2017-03-10 MED ORDER — CYCLOBENZAPRINE HCL 10 MG PO TABS: 10.0000 mg | ORAL_TABLET | Freq: Three times a day (TID) | ORAL | 3 refills | Status: DC | PRN

## 2017-03-10 NOTE — Telephone Encounter (Signed)
Spoke to patient, he thinks he had anothr seizure in his sleep last night. At this point it is 4 seizures. Starting keppra.

## 2017-03-13 ENCOUNTER — Ambulatory Visit (INDEPENDENT_AMBULATORY_CARE_PROVIDER_SITE_OTHER): Payer: 59 | Admitting: Neurology

## 2017-03-13 ENCOUNTER — Encounter: Payer: Self-pay | Admitting: Neurology

## 2017-03-13 ENCOUNTER — Ambulatory Visit (INDEPENDENT_AMBULATORY_CARE_PROVIDER_SITE_OTHER): Payer: 59

## 2017-03-13 DIAGNOSIS — R569 Unspecified convulsions: Secondary | ICD-10-CM

## 2017-03-13 MED ORDER — GADOPENTETATE DIMEGLUMINE 469.01 MG/ML IV SOLN: 20.0000 mL | Freq: Once | INTRAVENOUS | Status: AC | PRN

## 2017-03-13 NOTE — Procedures (Signed)
    History:  Antonio Cardenas is a 31 year old patient with a history of seizure-like activity associated with possible benzodiazepine withdrawal. The patient was confused following the event with tongue biting, but with no urinary incontinence. The patient may have had a possible seizure 10 years ago associated with drinking alcohol and using drugs. The patient is being evaluated for the this event.  This is a routine EEG. No skull defects are noted. Medications include Xanax, aspirin, Flexeril, lisinopril, Ativan, multivitamins, Crestor, and Effexor.   EEG classification: Normal awake  Description of the recording: The background rhythms of this recording consists of a fairly well modulated medium amplitude alpha rhythm of 10 Hz that is reactive to eye opening and closure. As the record progresses, the patient appears to remain in the waking state throughout the recording. Photic stimulation was performed, resulting in a bilateral and symmetric photic driving response. Hyperventilation was also performed, resulting in a minimal buildup of the background rhythm activities without significant slowing seen. At no time during the recording does there appear to be evidence of spike or spike wave discharges or evidence of focal slowing. EKG monitor shows no evidence of cardiac rhythm abnormalities with a heart rate of 78.  Impression: This is a normal EEG recording in the waking state. No evidence of ictal or interictal discharges are seen.

## 2017-03-19 ENCOUNTER — Telehealth: Payer: Self-pay | Admitting: Neurology

## 2017-03-19 NOTE — Telephone Encounter (Signed)
Antonio Cardenas, Antonia B, MD  Donnelly AngelicaHogan, Jennifer L, RN      MRI brain normal thanks         EEG is normal. I would like to have a 72 hour EEG completed with [facility], would you see if he is willing? We discussed at appt. thanks

## 2017-03-19 NOTE — Telephone Encounter (Signed)
Patient called office requesting MRI results, states results have been posted to his mychart account.  Please call

## 2017-03-19 NOTE — Telephone Encounter (Signed)
Called pt w/ normal MRI and EEG results. Asked that he call back if he'd like to proceed w/ 72 hr EEG.

## 2017-05-10 NOTE — Telephone Encounter (Signed)
ERROR

## 2017-05-24 ENCOUNTER — Telehealth: Payer: Self-pay

## 2017-05-24 MED ORDER — CYCLOBENZAPRINE HCL 10 MG PO TABS: 10.0000 mg | ORAL_TABLET | Freq: Three times a day (TID) | ORAL | 3 refills | Status: DC | PRN

## 2017-05-24 MED ORDER — LEVETIRACETAM 500 MG PO TABS: 500.0000 mg | ORAL_TABLET | Freq: Two times a day (BID) | ORAL | 3 refills | Status: AC

## 2017-05-24 NOTE — Telephone Encounter (Signed)
Refills sent to patients mail order pharmacy for 90 day supply.

## 2017-06-11 ENCOUNTER — Encounter: Payer: Self-pay | Admitting: Neurology

## 2017-06-12 ENCOUNTER — Encounter: Payer: Self-pay | Admitting: Neurology

## 2017-07-08 ENCOUNTER — Ambulatory Visit: Payer: 59 | Admitting: Neurology

## 2017-07-08 ENCOUNTER — Encounter: Payer: Self-pay | Admitting: Neurology

## 2017-07-08 VITALS — BP 127/80 | HR 82 | Ht 69.0 in | Wt 203.6 lb

## 2017-07-08 DIAGNOSIS — R569 Unspecified convulsions: Secondary | ICD-10-CM | POA: Diagnosis not present

## 2017-07-08 DIAGNOSIS — F19230 Other psychoactive substance dependence with withdrawal, uncomplicated: Secondary | ICD-10-CM

## 2017-07-08 DIAGNOSIS — F19930 Other psychoactive substance use, unspecified with withdrawal, uncomplicated: Secondary | ICD-10-CM

## 2017-07-08 NOTE — Progress Notes (Signed)
[facility]    Provider:  Dr Lucia Gaskins Referring Provider: Eartha Inch, MD Primary Care Physician:  Eartha Inch, MD  CC:  Seizure-like activity  Interval history 07/08/2017: Here for follow up of seizure-like activity in the setting of possible Benzo withdrawal. Having side effects to Keppra. No more events. Discussed the possibility of these being provoked wue to withdrawal. Had a long conversation, cannot and sure that he does not have an underlying seizure disorder however both seizures were provoked by illness, drugs or alcohol.  MRI of the brain and routine EEG were negative.  I do think patient should have a 72-hour EEG to complete his workup however he declines, I have tried to encourage him.  At this time they would like to titrate off of the Keppra.  Warned about breakthrough seizures.   MRI brain 03/2017: showed No acute intracranial abnormalities including mass lesion or mass effect, hydrocephalus, extra-axial fluid collection, midline shift, hemorrhage, or acute infarction, large ischemic events (personally reviewed images)  Routine EEG normal    HPI:  Antonio Cardenas is a 31 y.o. male here as a referral from Dr. Cyndia Bent for seizure-like activity in the setting of possible benzodiazepine withdrawal and illness. He had gone to the doctor and diagnosed with strep pharyngitis. He had a sore throat, a little fever and then he was driving and then the next thing he knew he was in the ambulance. Bystanders saw the car veer to the side of the road,, wife here and also provides information as well. He called wife and he was still incoherent from the ambulance. He bit his tongue. No urination. He had a possible seizure 10 years ago, he was drinking and using drugs for two days and hadn't slept in 48 hours and he woke up on a stretcher. In between these 2 episodes no other seizure-like events. He is still tired. His tongue still hurts. He continues to have some  memory changes. He was on Xanax and now on ativan now as a taper. He had ran out of xanax for a day or two. He had an entire workup 10 years ago, eegs, MRIs and all were negative. Aunt with epilepsy. No other focal neurologic deficits, associated symptoms, inciting events or modifiable factors.  Reviewed notes, labs and imaging from outside physicians, which showed:  Patient was seen in the emergency room 02/27/2017 for possible seizure activity in the setting of benzodiazepine withdrawal and illness. He has a history of anxiety and takes Xanax routinely and ran out of his Xanax 2 days prior. Also developed sore throat, body aches and subjective fevers and chills. He was diagnosed with viral pharyngitis. On the way home he felt hot and dizzy, started driving off to the side of the road and the next thing he knew he was waking up to an ambulance. Bystanders told EMS they had seen patient rolling slowly off the side of the road and stopped, he was sweaty and shaking, he had tongue biting, and he was briefly confused and round. He had one possible seizure over 10 years ago while he was in vagus endorse alcohol and drug use during that time. Diagnosis was potential benzodiazepine withdrawal seizure. Normal neurologic exam. EKG was normal.  CT head showed No acute intracranial abnormalities including mass lesion or mass effect, hydrocephalus, extra-axial fluid collection, midline shift, hemorrhage, or acute infarction, large ischemic events (personally reviewed images)  BMP was unremarkable, CBC showed elevated white blood cells is 13.4.   Review of  Systems: Patient complains of symptoms per HPI as well as the following symptoms: Fevers chills, fatigue, blurred vision, cough, snoring, feeling hot, feeling cold, spinning sensation, memory loss, confusion, headache, numbness, slurred speech, dizziness, seizures, depression, anxiety, disinterest and activities, decreased energy, sleepiness. Pertinent  negatives and positives per HPI. All others negative.     Social History   Socioeconomic History  . Marital status: Single    Spouse name: Not on file  . Number of children: Not on file  . Years of education: Not on file  . Highest education level: Not on file  Social Needs  . Financial resource strain: Not on file  . Food insecurity - worry: Not on file  . Food insecurity - inability: Not on file  . Transportation needs - medical: Not on file  . Transportation needs - non-medical: Not on file  Occupational History  . Not on file  Tobacco Use  . Smoking status: Former Smoker    Types: Cigarettes    Last attempt to quit: 04/07/2012    Years since quitting: 5.2  . Smokeless tobacco: Current User  Substance and Sexual Activity  . Alcohol use: Yes    Alcohol/week: 4.2 oz    Types: 7 Shots of liquor per week  . Drug use: No  . Sexual activity: Yes    Birth control/protection: Condom  Other Topics Concern  . Not on file  Social History Narrative  . Not on file    No family history on file.  Past Medical History:  Diagnosis Date  . Anxiety   . Hypertension   . Seizures (HCC)     Past Surgical History:  Procedure Laterality Date  . TONSILLECTOMY      Current Outpatient Medications  Medication Sig Dispense Refill  . ALPRAZolam (XANAX) 0.5 MG tablet Take 0.5 mg by mouth at bedtime as needed for anxiety.    . Artificial Tear Ointment (DRY EYES OP) Place 1 drop into both eyes as needed (dry eye).    . Aspirin-Salicylamide-Caffeine (BC HEADACHE POWDER PO) Take 1 tablet by mouth daily as needed.     . cyclobenzaprine (FLEXERIL) 10 MG tablet Take 1 tablet (10 mg total) by mouth 3 (three) times daily as needed for muscle spasms. 90 tablet 3  . ibuprofen (ADVIL,MOTRIN) 200 MG tablet Take 800 mg by mouth every 6 (six) hours as needed for pain.    Marland Kitchen levETIRAcetam (KEPPRA) 500 MG tablet Take 1 tablet (500 mg total) by mouth 2 (two) times daily. 180 tablet 3  . lisinopril  (PRINIVIL,ZESTRIL) 20 MG tablet Take 20 mg by mouth daily.    Marland Kitchen LORazepam (ATIVAN) 1 MG tablet Take 2 mg every 6 hours for 4 doses, then  Take 1 mg every 6 hours for 4 doses, then  Take 1 mg every 12 hours for 4 doses 16 tablet 0  . LORazepam (ATIVAN) 1 MG tablet Take 1 tablet (1 mg total) by mouth See admin instructions. Take 1 tablet by mouth four times a day for 7 days. Then take 1 mg by mouth three times a day for 7 days. Then take 1 mg by mouth two times a day for 7 days. Then take 0.5 mg two times a day for 7 days. Then take 0.5 mg once daily for 7 days 64 tablet 0  . meclizine (ANTIVERT) 25 MG tablet Take 1 tablet (25 mg total) by mouth 3 (three) times daily as needed for dizziness. 30 tablet 0  . Multiple Vitamin (MULTIVITAMIN)  tablet Take 1 tablet by mouth daily.    . rosuvastatin (CRESTOR) 20 MG tablet Take 20 mg by mouth daily.    Marland Kitchen. venlafaxine XR (EFFEXOR-XR) 75 MG 24 hr capsule Take 75 mg by mouth 3 (three) times daily.     No current facility-administered medications for this visit.    Facility-Administered Medications Ordered in Other Visits  Medication Dose Route Frequency Provider Last Rate Last Dose  . gadopentetate dimeglumine (MAGNEVIST) injection 20 mL  20 mL Intravenous Once PRN Anson FretAhern, Antonia B, MD        Allergies as of 07/08/2017  . (No Known Allergies)    Vitals: BP 127/80   Pulse 82   Ht 5\' 9"  (1.753 m)   Wt 203 lb 9.6 oz (92.4 kg)   BMI 30.07 kg/m  Last Weight:  Wt Readings from Last 1 Encounters:  07/08/17 203 lb 9.6 oz (92.4 kg)   Last Height:   Ht Readings from Last 1 Encounters:  07/08/17 5\' 9"  (1.753 m)   Physical exam: Exam: Gen: NAD, conversant, well nourised, obese, well groomed                     CV: RRR, no MRG. No Carotid Bruits. No peripheral edema, warm, nontender Eyes: Conjunctivae clear without exudates or hemorrhage  Neuro: Detailed Neurologic Exam  Speech:    Speech is normal; fluent and spontaneous with normal comprehension.   Cognition:    The patient is oriented to person, place, and time;     recent and remote memory intact;     language fluent;     normal attention, concentration,     fund of knowledge Cranial Nerves:    The pupils are equal, round, and reactive to light. The fundi are normal and spontaneous venous pulsations are present. Visual fields are full to finger confrontation. Extraocular movements are intact. Trigeminal sensation is intact and the muscles of mastication are normal. The face is symmetric. The palate elevates in the midline. Hearing intact. Voice is normal. Shoulder shrug is normal. The tongue has normal motion without fasciculations.   Coordination:    Normal finger to nose and heel to shin. Normal rapid alternating movements.   Gait:    Heel-toe and tandem gait are normal.   Motor Observation:    No asymmetry, no atrophy, and no involuntary movements noted. Tone:    Normal muscle tone.    Posture:    Posture is normal. normal erect    Strength:    Strength is V/V in the upper and lower limbs.      Sensation: intact to LT     Reflex Exam:  DTR's:    Deep tendon reflexes in the upper and lower extremities are normal bilaterally.   Toes:    The toes are downgoing bilaterally.   Clonus:    Clonus is absent.      Assessment/Plan:  31 year old male with a past medical history of anxiety and depression here for seizure in the setting of illness as well as possible benzodiazepine withdrawal. Seizure 10 years ago in the setting of drugs and alcohol.  Full seizure workup negative including MRI of the brain with and without contrast, routine EEG. Likely a provoked seizure, discussed coming off of the Keppra but that cannot guarantee he will not have another seizure it is just more likely these were provoked. Will taper off of the Keppra. Discussed in detail with wife and patient. Reviewed MRI images as well and  EEG results. He declined a 72-hour EEG despite encouragement to  have it completed.  Discussed: Patient is unable to drive, operate heavy machinery, perform activities at heights or participate in water activities until 6 months seizure free.  Discussed Patients with epilepsy have a small risk of sudden unexpected death, a condition referred to as sudden unexpected death in epilepsy (SUDEP). SUDEP is defined specifically as the sudden, unexpected, witnessed or unwitnessed, nontraumatic and nondrowning death in patients with epilepsy with or without evidence for a seizure, and excluding documented status epilepticus, in which post mortem examination does not reveal a structural or toxicologic cause for death   Cc: Dr. Adonis HugueninBadger   Antonia Ahern, MD  [facility] [address] [place], [place] [id number]  Phone [phone] Fax [phone]  A total of 25 minutes was spent face-to-face with this patient. Over half this time was spent on counseling patient on the withdrawal seizure diagnosis and different diagnostic and therapeutic options available.

## 2017-07-08 NOTE — Patient Instructions (Signed)
Will decrease Keppra by 250mg  each week.    Seizure, Adult A seizure is a sudden burst of abnormal electrical activity in the brain. The abnormal activity temporarily interrupts normal brain function, causing a person to experience any of the following:  Involuntary movements.  Changes in awareness or consciousness.  Uncontrollable shaking (convulsions).  Seizures usually last from 30 seconds to 2 minutes. They usually do not cause permanent brain damage unless they are prolonged. What can cause a seizure to happen? Seizures can happen for many reasons including:  A fever.  Low blood sugar.  A medicine.  An illnesses.  A brain injury.  Some people who have a seizure never have another one. People who have repeated seizures have a condition called epilepsy. What are the symptoms of a seizure? Symptoms of a seizure vary greatly from person to person. They include:  Convulsions.  Stiffening of the body.  Involuntary movements of the arms or legs.  Loss of consciousness.  Breathing problems.  Falling suddenly.  Confusion.  Head nodding.  Eye blinking or fluttering.  Lip smacking.  Drooling.  Rapid eye movements.  Grunting.  Loss of bladder control and bowel control.  Staring.  Unresponsiveness.  Some people have symptoms right before a seizure happens (aura) and right after a seizure happens. Symptoms of an aura include:  Fear or anxiety.  Nausea.  Feeling like the room is spinning (vertigo).  A feeling of having seen or heard something before (deja vu).  Odd tastes or smells.  Changes in vision, such as seeing flashing lights or spots.  Symptoms that may follow a seizure include:  Confusion.  Sleepiness.  Headache.  Weakness of one side of the body.  Follow these instructions at home: Medicines   Take over-the-counter and prescription medicines only as told by your health care provider.  Avoid any substances that may prevent  your medicine from working properly, such as alcohol. Activity  Do not drive, swim, or do any other activities that would be dangerous if you had another seizure. Wait until your health care provider approves.  If you live in the [place] check with your local DMV (department of motor vehicles) to find out about the local driving laws. Each state has specific rules about when you can legally return to driving.  Get enough rest. Lack of sleep can make seizures more likely to occur. Educating others Teach friends and family what to do if you have a seizure. They should:  Lay you on the ground to prevent a fall.  Cushion your head and body.  Loosen any tight clothing around your neck.  Turn you on your side. If vomiting occurs, this helps keep your airway clear.  Stay with you until you recover.  Not hold you down. Holding you down will not stop the seizure.  Not put anything in your mouth.  Know whether or not you need emergency care.  General instructions  Contact your health care provider each time you have a seizure.  Avoid anything that has ever triggered a seizure for you.  Keep a seizure diary. Record what you remember about each seizure, especially anything that might have triggered the seizure.  Keep all follow-up visits as told by your health care provider. This is important. Contact a health care provider if:  You have another seizure.  You have seizures more often.  Your seizure symptoms change.  You continue to have seizures with treatment.  You have symptoms of an infection or illness. They  might increase your risk of having a seizure. Get help right away if:  You have a seizure: ? That lasts longer than 5 minutes. ? That is different than previous seizures. ? That leaves you unable to speak or use a part of your body. ? That makes it harder to breathe. ? After a head injury.  You have: ? Multiple seizures in a row. ? Confusion or a severe headache  right after a seizure.  You are having seizures more often.  You do not wake up immediately after a seizure.  You injure yourself during a seizure. These symptoms may represent a serious problem that is an emergency. Do not wait to see if the symptoms will go away. Get medical help right away. Call your local emergency services (911 in the [place] Do not drive yourself to the hospital. This information is not intended to replace advice given to you by your health care provider. Make sure you discuss any questions you have with your health care provider. Document Released: 07/20/2000 Document Revised: 03/18/2016 Document Reviewed: 02/24/2016 Elsevier Interactive Patient Education  2017 [employer]

## 2017-07-19 ENCOUNTER — Encounter: Payer: Self-pay | Admitting: Neurology

## 2017-09-18 ENCOUNTER — Other Ambulatory Visit: Payer: Self-pay | Admitting: Neurology

## 2017-10-31 ENCOUNTER — Ambulatory Visit: Payer: Self-pay | Admitting: Emergency Medicine

## 2017-10-31 VITALS — BP 130/70 | HR 89 | Temp 98.4°F | Resp 16 | Wt 192.2 lb

## 2017-10-31 DIAGNOSIS — L02412 Cutaneous abscess of left axilla: Secondary | ICD-10-CM

## 2017-10-31 MED ORDER — IBUPROFEN 800 MG PO TABS: 800.0000 mg | ORAL_TABLET | Freq: Three times a day (TID) | ORAL | 0 refills | Status: AC | PRN

## 2017-10-31 MED ORDER — CEPHALEXIN 500 MG PO CAPS: 500.0000 mg | ORAL_CAPSULE | Freq: Four times a day (QID) | ORAL | 0 refills | Status: AC

## 2017-10-31 NOTE — Progress Notes (Signed)
S: Antonio Cardenas is a 32 y.o. male who presents for "cyst" of the left axilla, was seen at [facility] 48 hours ago, started on Bactrim. He reports symptoms have continued to worsen, and has increased pain, and pain with movement and pressure. Denies fever, chills, n/v/d, or other symptoms. Otherwise reports to be in good health.  Review of Systems  Constitutional: Negative for chills, fever and malaise/fatigue.  Gastrointestinal: Negative for diarrhea, nausea and vomiting.  Musculoskeletal: Negative for myalgias.  Skin:       "cyst"  Neurological: Negative for dizziness and headaches.    O: Vitals:   10/31/17 1339  BP: 130/70  Pulse: 89  Resp: 16  Temp: 98.4 F (36.9 C)  SpO2: 98%   Physical Exam  Constitutional: He appears well-developed and well-nourished. No distress.  Lymphadenopathy:    He has axillary adenopathy.       Left axillary: Lateral adenopathy present.  Neurological: He is alert.  Skin: Skin is warm and dry. Capillary refill takes less than 2 seconds. He is not diaphoretic.  2X3 cm abscess left axilla with erythema and induration, no evidence of coexisting cellulitis.  Psychiatric: He has a normal mood and affect.  Nursing note and vitals reviewed.   A: 1. Cutaneous abscess of left axilla     P:  I&D preformed with consent. Continue Bactrim, start keflex and Ibuprofen 800 mg. Monitor for signs of infection. ER if symptoms worsen, otherwise follow up here as needed.  Procedure:  Verbal consent obtained. Area over induration cleaned with betadine. Lidocaine 2% with epinephrine used to obtain local anesthesia. The most fluctuant portion of the abscess was incised with a #11 blade scalpel. Abscess cavity explored and evacuated. Loculations broken up with a curved hemostat as best as possible given patient discomfort. Cavity was not packed, it was dressed with a clean gauze dressing. Minimal bleeding. No complications.  Wound care instructions discussed and  given in written format. To return in 48 hours for wound check.

## 2017-10-31 NOTE — Patient Instructions (Addendum)
Skin Abscess Continue bactrim as prescribed by [facility] Start Keflex to broaden coverage Ibuprofen 800 mg for pain along with OTC tylenol at the same time  Keep it covered, avoid swimming, baths, keep covered while in the shower, follow up as needed, or ER if signs of infection   A skin abscess is an infected area on or under your skin that contains pus and other material. An abscess can happen almost anywhere on your body. Some abscesses break open (rupture) on their own. Most continue to get worse unless they are treated. The infection can spread deeper into the body and into your blood, which can make you feel sick. Treatment usually involves draining the abscess. Follow these instructions at home: Abscess Care  If you have an abscess that has not drained, place a warm, clean, wet washcloth over the abscess several times a day. Do this as told by your doctor.  Follow instructions from your doctor about how to take care of your abscess. Make sure you: ? Cover the abscess with a bandage (dressing). ? Change your bandage or gauze as told by your doctor. ? Wash your hands with soap and water before you change the bandage or gauze. If you cannot use soap and water, use hand sanitizer.  Check your abscess every day for signs that the infection is getting worse. Check for: ? More redness, swelling, or pain. ? More fluid or blood. ? Warmth. ? More pus or a bad smell. Medicines   Take over-the-counter and prescription medicines only as told by your doctor.  If you were prescribed an antibiotic medicine, take it as told by your doctor. Do not stop taking the antibiotic even if you start to feel better. General instructions  To avoid spreading the infection: ? Do not share personal care items, towels, or hot tubs with others. ? Avoid making skin-to-skin contact with other people.  Keep all follow-up visits as told by your doctor. This is important. Contact a doctor if:  You have more  redness, swelling, or pain around your abscess.  You have more fluid or blood coming from your abscess.  Your abscess feels warm when you touch it.  You have more pus or a bad smell coming from your abscess.  You have a fever.  Your muscles ache.  You have chills.  You feel sick. Get help right away if:  You have very bad (severe) pain.  You see red streaks on your skin spreading away from the abscess. This information is not intended to replace advice given to you by your health care provider. Make sure you discuss any questions you have with your health care provider. Document Released: 01/09/2008 Document Revised: 03/18/2016 Document Reviewed: 06/01/2015 Elsevier Interactive Patient Education  [employer]2018 [employer]

## 2017-11-05 ENCOUNTER — Telehealth: Payer: Self-pay

## 2018-03-02 ENCOUNTER — Emergency Department
Admit: 2018-03-03 | Payer: PRIVATE HEALTH INSURANCE | Primary: Student in an Organized Health Care Education/Training Program

## 2018-03-02 ENCOUNTER — Inpatient Hospital Stay
Admit: 2018-03-02 | Discharge: 2018-03-03 | Disposition: A | Discharge status: Home / Self Care | Payer: PRIVATE HEALTH INSURANCE | Attending: Emergency Medicine

## 2018-03-02 DIAGNOSIS — S93401A Sprain of unspecified ligament of right ankle, initial encounter: Secondary | ICD-10-CM

## 2018-03-02 NOTE — ED Provider Notes (Signed)
[facility]  Emergency Department  Emergency Medicine Resident Sign-out     Care of Anson FretChristopher Baiz was [facility] from [facility] and is being seen for Ankle Pain  .  The patient's initial evaluation and plan have been discussed with the prior provider who initially evaluated the patient.     EMERGENCY DEPARTMENT COURSE / MEDICAL DECISION MAKING:       MEDICATIONS GIVEN:  Orders Placed This Encounter   Medications   ??? acetaminophen (TYLENOL) tablet 650 mg   ??? acetaminophen (TYLENOL) 325 MG tablet     Sig: Take 2 tablets by mouth every 6 hours as needed for Pain     Dispense:  30 tablet     Refill:  0   ??? ibuprofen (ADVIL;MOTRIN) 800 MG tablet     Sig: Take 1 tablet by mouth every 8 hours as needed for Pain     Dispense:  20 tablet     Refill:  0       LABS / RADIOLOGY:     Labs Reviewed - No data to display    Xr Tibia Fibula Right (2 Views)    Result Date: 03/02/2018  EXAMINATION: THREE XRAY VIEWS OF THE RIGHT ANKLE; THREE XRAY VIEWS OF THE RIGHT FOOT; 2 XRAY VIEWS OF THE RIGHT TIBIA AND FIBULA 03/02/2018 8:39 pm COMPARISON: None. HISTORY: ORDERING SYSTEM PROVIDED HISTORY: inversion injury TECHNOLOGIST PROVIDED HISTORY: inversion injury Reason for Exam: Twisted ankle. Acuity: Acute Type of Exam: Initial FINDINGS: No acute fracture or dislocation is demonstrated.  Well corticated ossifications distal to the medial and lateral malleoli appear chronic.  The ankle mortise and Lisfranc alignment appear maintained.  There is lateral malleolar soft tissue swelling.     Lateral malleolar soft tissue swelling.  No acute osseous abnormality of the right foot, right ankle, or right tibia/fibula     Xr Ankle Right (min 3 Views)    Result Date: 03/02/2018  EXAMINATION: THREE XRAY VIEWS OF THE RIGHT ANKLE; THREE XRAY VIEWS OF THE RIGHT FOOT; 2 XRAY VIEWS OF THE RIGHT TIBIA AND FIBULA 03/02/2018 8:39 pm COMPARISON: None. HISTORY: ORDERING SYSTEM PROVIDED HISTORY: inversion injury TECHNOLOGIST PROVIDED HISTORY:  inversion injury Reason for Exam: Twisted ankle. Acuity: Acute Type of Exam: Initial FINDINGS: No acute fracture or dislocation is demonstrated.  Well corticated ossifications distal to the medial and lateral malleoli appear chronic.  The ankle mortise and Lisfranc alignment appear maintained.  There is lateral malleolar soft tissue swelling.     Lateral malleolar soft tissue swelling.  No acute osseous abnormality of the right foot, right ankle, or right tibia/fibula     Xr Foot Right (min 3 Views)    Result Date: 03/02/2018  EXAMINATION: THREE XRAY VIEWS OF THE RIGHT ANKLE; THREE XRAY VIEWS OF THE RIGHT FOOT; 2 XRAY VIEWS OF THE RIGHT TIBIA AND FIBULA 03/02/2018 8:39 pm COMPARISON: None. HISTORY: ORDERING SYSTEM PROVIDED HISTORY: inversion injury TECHNOLOGIST PROVIDED HISTORY: inversion injury Reason for Exam: Twisted ankle. Acuity: Acute Type of Exam: Initial FINDINGS: No acute fracture or dislocation is demonstrated.  Well corticated ossifications distal to the medial and lateral malleoli appear chronic.  The ankle mortise and Lisfranc alignment appear maintained.  There is lateral malleolar soft tissue swelling.     Lateral malleolar soft tissue swelling.  No acute osseous abnormality of the right foot, right ankle, or right tibia/fibula       RECENT VITALS:     Temp: 98.4 ??F (36.9 ??C),  Pulse: 84, Resp:  18, BP: 134/70, SpO2: 98 %    This patient is a 32 y.o. Male with inversion ankle injury of right ankle.  Patient awaiting x-ray read.    X-ray without fracture, patient provided Aircast and crutches.  Discharged with ibuprofen and Tylenol.  OUTSTANDING TASKS / RECOMMENDATIONS:    1. Await XR     FINAL IMPRESSION:     1. Sprain of right ankle, unspecified ligament, initial encounter        DISPOSITION:         DISPOSITION:  []   Discharge   []   Transfer -    []   Admission -     []   Against Medical Advice   []   Eloped   FOLLOW-UP: [facility]  [address]  [place]  [address]  [phone]  Schedule an appointment as soon as possible for a visit   with the foot and ankle specialist for later this week     DISCHARGE MEDICATIONS: Discharge Medication List as of 03/02/2018  9:19 PM      START taking these medications    Details   acetaminophen (TYLENOL) 325 MG tablet Take 2 tablets by mouth every 6 hours as needed for Pain, Disp-30 tablet, R-0Print      ibuprofen (ADVIL;MOTRIN) 800 MG tablet Take 1 tablet by mouth every 8 hours as needed for Pain, Disp-20 tablet, R-0Print                 Hart Robinsons, MD  Emergency Medicine Resident  [facility]        Hart Robinsons, MD  03/03/18 0400

## 2018-03-02 NOTE — ED Provider Notes (Signed)
Emergency Medicine Attending Note    I have seen and examined the patient in conjunction with the Resident/MLP.  Please see my key portion documented:      I agree with the assessment and plan as discussed with PA Bretyl.    Electronically signed:  Marcelo BaldySam Marqueta Pulley, M.D.           Jesse SansSama Arcangel Minion, MD  03/02/18 2038

## 2018-03-02 NOTE — Discharge Instructions (Signed)
Rest.    Ice to the area for 10 minutes every hour.  Please wrap ice in a towel.    Elevate your ankle/leg    Air splint and crutches for comfort.    Advance weight-bearing as tolerated.  You may continue to use the air splint with a tennis shoe after you stop using crutches.    Return to the emergency room for increased pain, swelling, numbness or tingling, difficulty walking or any other emergent concerns

## 2018-03-02 NOTE — ED Provider Notes (Signed)
[facility] ED  Emergency Department Encounter  Advanced Practice Provider     Pt Name: Cole Kent  MRN: [id number]  Birthdate 07-Apr-1986  Date of evaluation: 03/02/18  PCP:  No primary care provider on file.    CHIEF COMPLAINT       Chief Complaint   Patient presents with   ??? Ankle Pain       HISTORY OF PRESENT ILLNESS  (Location/Symptom, Timing/Onset,Context/Setting, Quality, Duration, Modifying Factors, Severity.)      Cole Kent is a 32 y.o. male who presents with ankle pain.  Patient states that 2 days ago he was chasing his kids outdoors and stepped on a rock and inverted his right ankle.  Patient states that he did have multiple ankle sprains when he was a teenager, no history of any fracture.  He has noticed increased swelling and pain with ambulation.  He has not been taking any Tylenol or Motrin.  He denies any knee pain.    PAST MEDICAL /SURGICAL / SOCIAL / FAMILY HISTORY     No past medical problems, no previous surgery    Social History     Socioeconomic History   ??? Marital status: Single     Spouse name: Not on file   ??? Number of children: Not on file   ??? Years of education: Not on file   ??? Highest education level: Not on file   Occupational History   ??? Not on file   Social Needs   ??? Financial resource strain: Not on file   ??? Food insecurity:     Worry: Not on file     Inability: Not on file   ??? Transportation needs:     Medical: Not on file     Non-medical: Not on file   Tobacco Use   ??? Smoking status: Never Smoker   ??? Smokeless tobacco: Never Used   Substance and Sexual Activity   ??? Alcohol use: Never     Frequency: Never   ??? Drug use: Never   ??? Sexual activity: Not on file   Lifestyle   ??? Physical activity:     Days per week: Not on file     Minutes per session: Not on file   ??? Stress: Not on file   Relationships   ??? Social connections:     Talks on phone: Not on file     Gets together: Not on file     Attends religious service: Not on file     Active member of club or  organization: Not on file     Attends meetings of clubs or organizations: Not on file     Relationship status: Not on file   ??? Intimate partner violence:     Fear of current or ex partner: Not on file     Emotionally abused: Not on file     Physically abused: Not on file     Forced sexual activity: Not on file   Other Topics Concern   ??? Not on file   Social History Narrative   ??? Not on file       History reviewed. No pertinent family history.    Allergies:  Patient has no known allergies.    Home Medications:  Prior to Admission medications    Medication Sig Start Date End Date Taking? Authorizing Provider   acetaminophen (TYLENOL) 325 MG tablet Take 2 tablets by mouth every 6 hours as needed for Pain 03/02/18  Yes Denton Ar, PA-C   ibuprofen (ADVIL;MOTRIN) 800 MG tablet Take 1 tablet by mouth every 8 hours as needed for Pain 03/02/18  Yes Denton Ar, PA-C       patient's medication list has been reviewed as entered by the nursing staff.    REVIEW OF SYSTEMS    (2-9 systems for level 4, 10 or more for level 5)      Review of Systems   Constitutional: Negative for chills and fever.   HENT: Negative for ear pain, rhinorrhea and sore throat.    Respiratory: Negative for cough, shortness of breath and wheezing.    Cardiovascular: Negative for chest pain and palpitations.   Gastrointestinal: Negative for nausea and vomiting.   Musculoskeletal: Positive for arthralgias and myalgias. Negative for back pain.   Skin: Negative for rash and wound.   Psychiatric/Behavioral: Negative for dysphoric mood.       PHYSICAL EXAM  (up to 7 for level 4, 8 or more for level 5)      INITIAL VITALS:  oral temperature is 98.4 ??F (36.9 ??C). His blood pressure is 134/70 and his pulse is 84. His respiration is 18 and oxygen saturation is 98%.      Physical Exam   Constitutional: He is oriented to person, place, and time. He appears well-developed and well-nourished.   HENT:   Head: Normocephalic and atraumatic.   Right Ear: External ear  normal.   Left Ear: External ear normal.   Eyes: Right eye exhibits no discharge. Left eye exhibits no discharge. No scleral icterus.   Neck: No tracheal deviation present.   Pulmonary/Chest: Effort normal. No stridor. No respiratory distress.   Musculoskeletal: He exhibits no edema.        Right ankle: He exhibits normal range of motion, no swelling, no ecchymosis, no deformity, no laceration and normal pulse. Tenderness. Lateral malleolus and head of 5th metatarsal tenderness found.        Left lower leg: He exhibits tenderness.        Left foot: There is decreased range of motion, tenderness, bony tenderness and swelling.        Feet:    Neurological: He is alert and oriented to person, place, and time. Coordination normal.   Skin: Skin is warm and dry. He is not diaphoretic.   Psychiatric: He has a normal mood and affect. His behavior is normal.       DIFFERENTIAL  DIAGNOSIS       fracture versus sprain    PLAN (LABS / IMAGING / EKG):  Orders Placed This Encounter   Procedures   ??? XR ANKLE RIGHT (MIN 3 VIEWS)   ??? XR TIBIA FIBULA RIGHT (2 VIEWS)   ??? XR FOOT RIGHT (MIN 3 VIEWS)       MEDICATIONS ORDERED:  Orders Placed This Encounter   Medications   ??? acetaminophen (TYLENOL) tablet 650 mg   ??? acetaminophen (TYLENOL) 325 MG tablet     Sig: Take 2 tablets by mouth every 6 hours as needed for Pain     Dispense:  30 tablet     Refill:  0   ??? ibuprofen (ADVIL;MOTRIN) 800 MG tablet     Sig: Take 1 tablet by mouth every 8 hours as needed for Pain     Dispense:  20 tablet     Refill:  0       Controlled Substances Monitoring:      DIAGNOSTIC RESULTS / EMERGENCY DEPARTMENT COURSE / MDM  Upon my evaluation of the x-rays it does appear that there is a well-healed avulsion fracture to the cuboid area but no acute fracture.  Patient signed out pending radiology input    RADIOLOGY:   I directly visualized (with the attending physician) the following  imagesand reviewed the radiologist interpretations:  No results  found.    XR ANKLE RIGHT (MIN 3 VIEWS)   Final Result   Lateral malleolar soft tissue swelling.  No acute osseous abnormality of the   right foot, right ankle, or right tibia/fibula         XR TIBIA FIBULA RIGHT (2 VIEWS)   Final Result   Lateral malleolar soft tissue swelling.  No acute osseous abnormality of the   right foot, right ankle, or right tibia/fibula         XR FOOT RIGHT (MIN 3 VIEWS)   Final Result   Lateral malleolar soft tissue swelling.  No acute osseous abnormality of the   right foot, right ankle, or right tibia/fibula             LABS:  No results found for this visit on 03/02/18.      CONSULTS:  None    PROCEDURES:  None    FINAL IMPRESSION      1. Sprain of right ankle, unspecified ligament, initial encounter          DISPOSITION / PLAN     DISPOSITION Discharge - Pending Orders Complete    PATIENT [facility]:  [facility]  [address]  [place] [id number]  [phone]  Schedule an appointment as soon as possible for a visit   with the foot and ankle specialist for later this week      DISCHARGE MEDICATIONS:  New Prescriptions    ACETAMINOPHEN (TYLENOL) 325 MG TABLET    Take 2 tablets by mouth every 6 hours as needed for Pain    IBUPROFEN (ADVIL;MOTRIN) 800 MG TABLET    Take 1 tablet by mouth every 8 hours as needed for Pain       Denton ArJennifer Ilina Xu, PA-C   Emergency Medicine Physician Assistant    (Please note that portions of this note were completed with a voice recognition program.  Efforts were made to edit thedictations but occasionally words are mis-transcribed.)        Denton ArJennifer Cliff Damiani, PA-C  03/02/18 2111

## 2018-03-03 MED ORDER — ACETAMINOPHEN 325 MG PO TABS
325 MG | ORAL_TABLET | Freq: Four times a day (QID) | ORAL | 0 refills | Status: DC | PRN
Start: 2018-03-03 — End: 2019-05-15

## 2018-03-03 MED ORDER — IBUPROFEN 800 MG PO TABS
800 MG | ORAL_TABLET | Freq: Three times a day (TID) | ORAL | 0 refills | Status: DC | PRN
Start: 2018-03-03 — End: 2019-05-15

## 2018-03-03 MED ORDER — ACETAMINOPHEN 325 MG PO TABS
325 MG | Freq: Once | ORAL | Status: AC
Start: 2018-03-03 — End: 2018-03-02
  Administered 2018-03-03: 650 mg via ORAL

## 2018-03-03 MED FILL — ACETAMINOPHEN 325 MG PO TABS: 325 mg | ORAL | Qty: 2
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
## 2018-04-23 IMAGING — DX DG CHEST 2V
2 series · 2 of 2 positions shown · non-contrast
Comparison: None.

CLINICAL DATA: 30-year-old male with seizures and upper back pain.
Low-grade fever.

EXAM:
CHEST  2 VIEW

[chest pa]
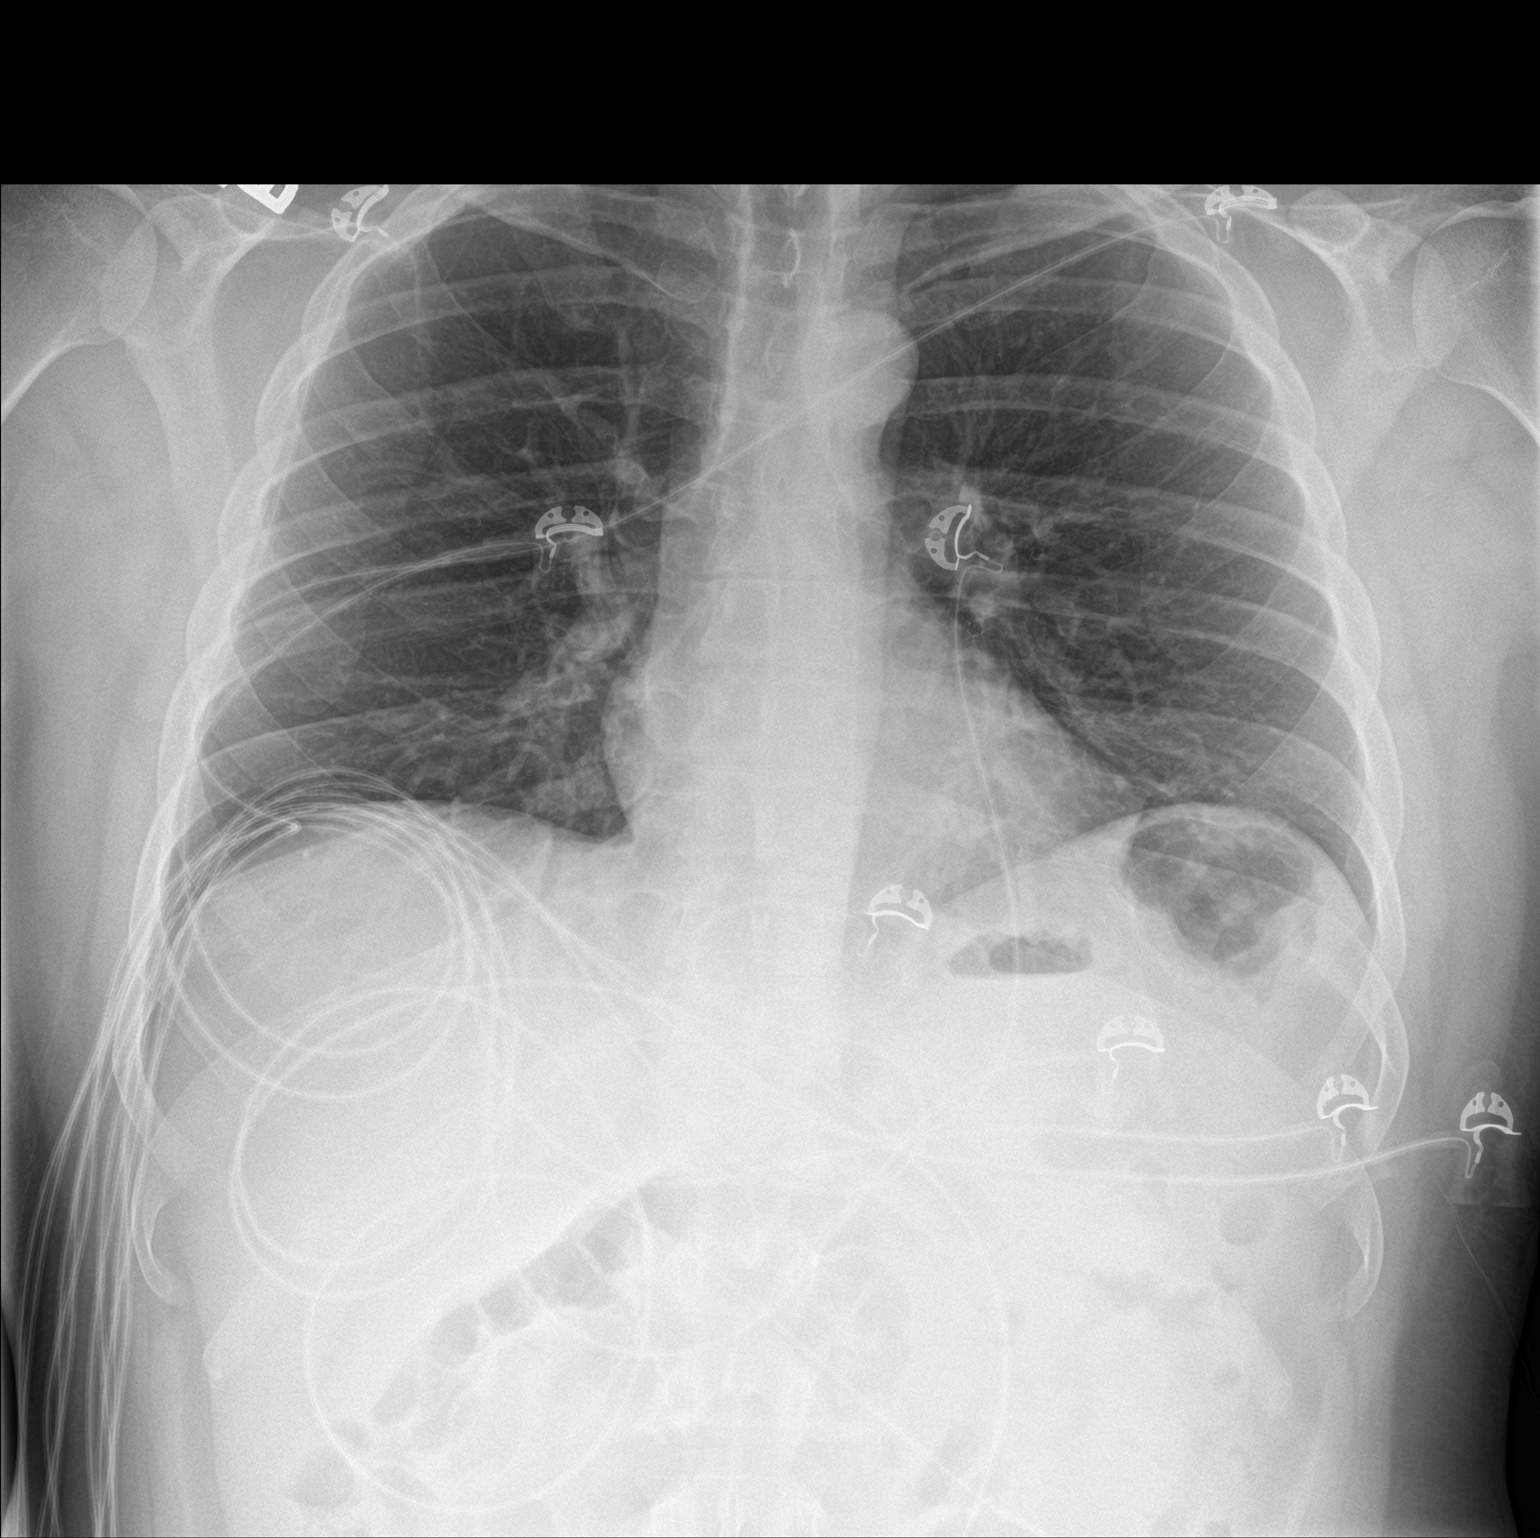

[chest lat]
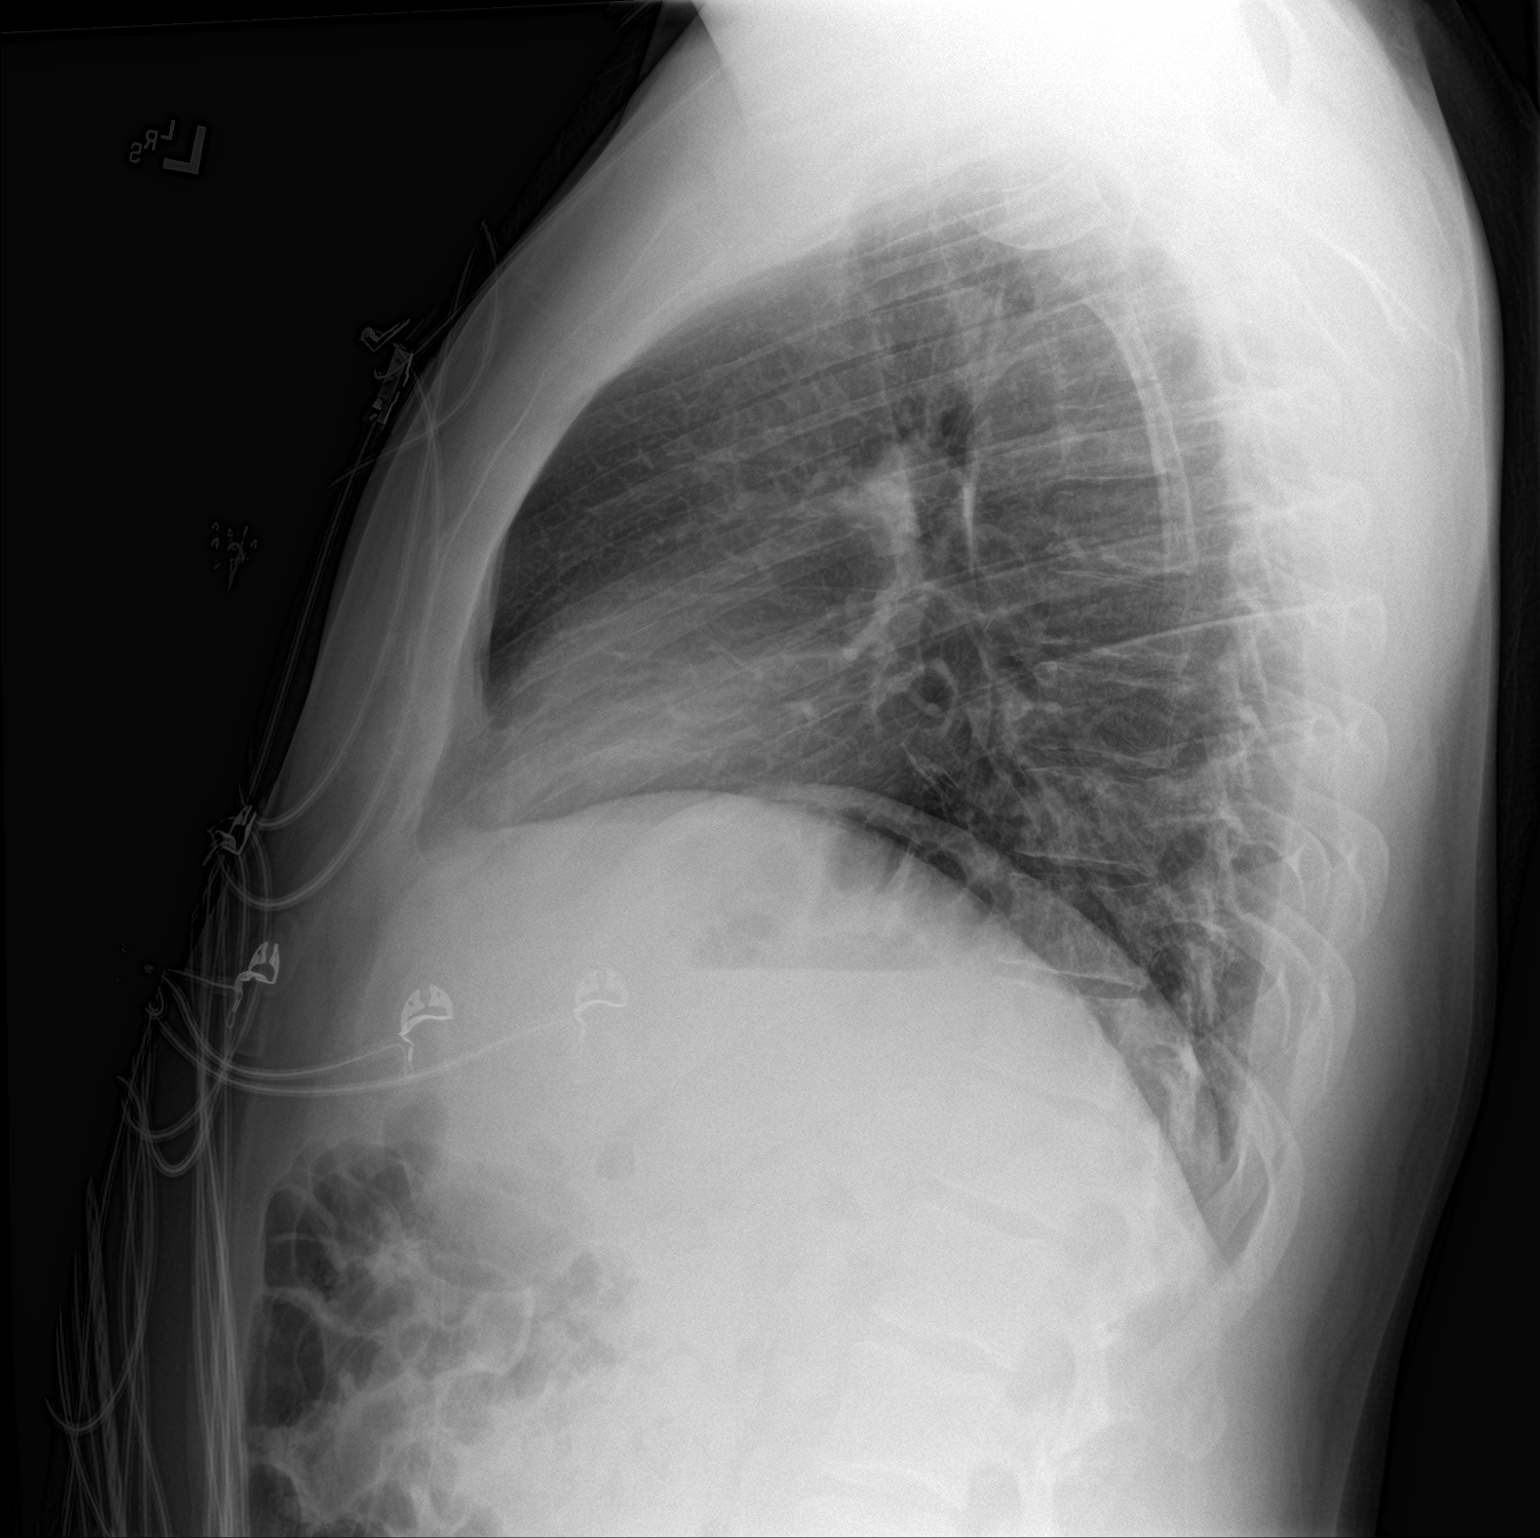

[2 of 2 positions shown; findings below may reference images not displayed]

FINDINGS: The heart size and mediastinal contours are within normal limits.
Both lungs are clear. The visualized skeletal structures are
unremarkable.
IMPRESSION: No active cardiopulmonary disease.

## 2018-07-16 ENCOUNTER — Emergency Department
Admit: 2018-07-16 | Payer: PRIVATE HEALTH INSURANCE | Primary: Student in an Organized Health Care Education/Training Program

## 2018-07-16 ENCOUNTER — Inpatient Hospital Stay
Admit: 2018-07-16 | Discharge: 2018-07-16 | Disposition: A | Discharge status: Home / Self Care | Payer: PRIVATE HEALTH INSURANCE | Attending: Emergency Medicine

## 2018-07-16 DIAGNOSIS — S96911A Strain of unspecified muscle and tendon at ankle and foot level, right foot, initial encounter: Secondary | ICD-10-CM

## 2018-07-16 MED ORDER — OXYCODONE-ACETAMINOPHEN 5-325 MG PO TABS
5-325 MG | ORAL_TABLET | Freq: Four times a day (QID) | ORAL | 0 refills | Status: AC | PRN
Start: 2018-07-16 — End: 2018-07-21

## 2018-07-16 MED ORDER — KETOROLAC TROMETHAMINE 60 MG/2ML IM SOLN
60 MG/2ML | Freq: Once | INTRAMUSCULAR | Status: AC
Start: 2018-07-16 — End: 2018-07-16
  Administered 2018-07-16: 08:00:00 60 mg via INTRAMUSCULAR

## 2018-07-16 MED ORDER — IBUPROFEN 800 MG PO TABS
800 MG | ORAL_TABLET | Freq: Three times a day (TID) | ORAL | 0 refills | Status: DC | PRN
Start: 2018-07-16 — End: 2019-05-15

## 2018-07-16 MED ORDER — OXYCODONE-ACETAMINOPHEN 5-325 MG PO TABS
5-325 MG | Freq: Once | ORAL | Status: AC
Start: 2018-07-16 — End: 2018-07-16
  Administered 2018-07-16: 08:00:00 1 via ORAL

## 2018-07-16 MED FILL — OXYCODONE-ACETAMINOPHEN 5-325 MG PO TABS: 5-325 mg | ORAL | Qty: 1

## 2018-07-16 MED FILL — KETOROLAC TROMETHAMINE 60 MG/2ML IM SOLN: 60 MG/2ML | INTRAMUSCULAR | Qty: 2

## 2018-07-16 NOTE — ED Notes (Signed)
Pt here with worsening right ankle outer aspect sweling and pain, onset today.  Pt denies any injury or trauma.  Ankle elevated and ice applied     Lenis DickinsonSandra E Ayodele Sangalang, [place]RN  07/16/18 [phone]

## 2018-07-16 NOTE — ED Provider Notes (Signed)
[facility]  eMERGENCY dEPARTMENT eNCOUnter      Pt Name: Cole Kent  MRN: [id number]  Birthdate May 24, 1986  Date of evaluation: 07/16/2018  Provider: Fabian SharpKaren J Stacey Erwin, MD    CHIEF COMPLAINT       Chief Complaint   Patient presents with   ??? Ankle Pain     right         HISTORY OF PRESENT ILLNESS  (Location/Symptom, Timing/Onset, Context/Setting, Quality, Duration, Modifying Factors, Severity.)   Cole Kent is a 32 y.o. male who presents to the emergency department for evaluation of ankle pain and swelling.  Patient is employed at [employer].  Is been working a significant number of hours.  He denies specific injury or trauma but has increasing pain and swelling to the lateral aspect of the right ankle.  He denies other complaints or      Nursing Notes were reviewed.    ALLERGIES     Patient has no known allergies.    CURRENT MEDICATIONS       Discharge Medication List as of 07/16/2018  2:47 AM      CONTINUE these medications which have NOT CHANGED    Details   acetaminophen (TYLENOL) 325 MG tablet Take 2 tablets by mouth every 6 hours as needed for Pain, Disp-30 tablet, R-0Print      !! ibuprofen (ADVIL;MOTRIN) 800 MG tablet Take 1 tablet by mouth every 8 hours as needed for Pain, Disp-20 tablet, R-0Print       !! - Potential duplicate medications found. Please discuss with provider.          PAST MEDICAL HISTORY   History reviewed. No pertinent past medical history.    SURGICAL HISTORY     History reviewed. No pertinent surgical history.      FAMILY HISTORY     History reviewed. No pertinent family history.  No family status information on file.        SOCIAL HISTORY      reports that he has never smoked. He has never used smokeless tobacco. He reports that he does not drink alcohol or use drugs.    REVIEW OF SYSTEMS    (2-9 systems for level 4, 10 or more for level 5)     Review of Systems   All other systems reviewed and are negative.      Except as noted above the remainder of the review of  systems was reviewed and negative.     PHYSICAL EXAM    (up to 7 for level 4, 8 or more for level 5)     Vitals:    07/16/18 0212   BP: (!) 140/74   Pulse: 83   Resp: 16   Temp: 98.5 ??F (36.9 ??C)   TempSrc: Oral   SpO2: 97%   Weight: 196 lb (88.9 kg)   Height: 5\' 9"  (1.753 m)       Physical exam reflects a well-nourished well-hydrated physically fit male.  He is afebrile with stable vital signs to include pulse ox of 100% on room air.  He is not hypoxic.  He is alert conversive and appropriate in behavior.  No evidence of a complaint of illness or injury to the face head neck chest abdomen bilateral upper extremities or left lower extremity.  With regard to the right lower extremity he does have swelling over the lateral malleolus as well as proximal lateral midfoot.  Achilles reflex is intact.  No discomfort to the phalanges  or metatarsals noted.  He does have some discomfort on palpation of the lateral malleolus.  All compartments of the lower extremity are soft.  He has full range of motion no evidence of knee discomfort swelling or instability.  Integument without rash or lesion.  No neurovascular deficits.      DIAGNOSTIC RESULTS       RADIOLOGY:   Non-plain film images such as CT, Ultrasound and MRI are read by the radiologist. Plain radiographic images are visualized and preliminarily interpreted by the emergency physician with the below findings:      Interpretation per the Radiologist below, if available at the time of this note:    XR ANKLE RIGHT (MIN 3 VIEWS)   Final Result   No acute fracture or malalignment.               EMERGENCY DEPARTMENT COURSE and DIFFERENTIAL DIAGNOSIS/MDM:   Vitals:    Vitals:    07/16/18 0212   BP: (!) 140/74   Pulse: 83   Resp: 16   Temp: 98.5 ??F (36.9 ??C)   TempSrc: Oral   SpO2: 97%   Weight: 196 lb (88.9 kg)   Height: 5\' 9"  (1.753 m)     Patient is evaluated.  Imaging studies are reviewed.  Presentation is consistent with ankle sprain/strain.  He is placed in an ace wrap and  Aircast applied by nursing staff.  Found to be appropriate and positioning by myself.  He is neurovascularly intact before and after placement.  Pain is treated.  He will be discharged home on symptomatic management.  He is advised follow-up with podiatry.    CONSULTS:  None    PROCEDURES:  None    FINAL IMPRESSION      1. Strain of right ankle, initial encounter          DISPOSITION/PLAN   DISPOSITION Decision To Discharge 07/16/2018 02:45:09 AM      PATIENT REFERRED TO:   Loletta Specter, DPM  [address]  [place] [address]  [phone]    Schedule an appointment as soon as possible for a visit       [id number]  You  may also call [phone] for re-evaluation and additional care        [facility]  [address] [place] [address]  [phone]    As needed, If symptoms worsen      DISCHARGE MEDICATIONS:     Discharge Medication List as of 07/16/2018  2:47 AM      START taking these medications    Details   !! ibuprofen (ADVIL;MOTRIN) 800 MG tablet Take 1 tablet by mouth every 8 hours as needed for Pain, Disp-30 tablet, R-0Print      oxyCODONE-acetaminophen (PERCOCET) 5-325 MG per tablet Take 1 tablet by mouth every 6 hours as needed for Pain for up to 5 days., Disp-20 tablet, R-0Print       !! - Potential duplicate medications found. Please discuss with provider.          Controlled Substance Monitoring:    Acute and Chronic Pain Monitoring:   RX Monitoring 07/16/2018   Periodic Controlled Substance Monitoring No signs of potential drug abuse or diversion identified.         (Please note that portions of this note were completed with a voice recognition program.  Efforts were made to edit the dictations but occasionally words are mis-transcribed.)    Fabian Sharp, MD  Attending  Emergency Physician          Fabian Sharp, MD  07/16/18 [phone]

## 2018-09-03 ENCOUNTER — Inpatient Hospital Stay
Admit: 2018-09-03 | Discharge: 2018-09-03 | Disposition: A | Discharge status: Home / Self Care | Payer: PRIVATE HEALTH INSURANCE | Attending: Emergency Medicine

## 2018-09-03 DIAGNOSIS — R300 Dysuria: Secondary | ICD-10-CM

## 2018-09-03 LAB — URINALYSIS WITH MICROSCOPIC
Bilirubin Urine: NEGATIVE
Casts UA: 0 /LPF (ref 0–8)
Epithelial Cells, UA: 0 /[HPF] (ref 0–5)
Glucose, Ur: NEGATIVE
Ketones, Urine: NEGATIVE
Nitrite, Urine: NEGATIVE
Protein, UA: NEGATIVE
RBC, UA: 2 /HPF (ref 0–4)
Specific Gravity, UA: 1.005 (ref 1.005–1.030)
Urobilinogen, Urine: NORMAL
WBC, UA: 50 /HPF (ref 0–5)
pH, UA: 7 (ref 5.0–8.0)

## 2018-09-03 MED ORDER — AZITHROMYCIN 250 MG PO TABS
250 MG | Freq: Once | ORAL | Status: AC
Start: 2018-09-03 — End: 2018-09-03
  Administered 2018-09-03: 15:00:00 1000 mg via ORAL

## 2018-09-03 MED ORDER — CEFTRIAXONE SODIUM 250 MG IJ SOLR
250 MG | Freq: Once | INTRAMUSCULAR | Status: AC
Start: 2018-09-03 — End: 2018-09-03
  Administered 2018-09-03: 15:00:00 250 mg via INTRAMUSCULAR

## 2018-09-03 MED FILL — CEFTRIAXONE SODIUM 250 MG IJ SOLR: 250 mg | INTRAMUSCULAR | Qty: 250

## 2018-09-03 MED FILL — AZITHROMYCIN 250 MG PO TABS: 250 mg | ORAL | Qty: 4

## 2018-09-03 NOTE — ED Provider Notes (Signed)
[facility] ED  Emergency Department Encounter  Advanced Practice Provider     Pt Name: Cole Kent  MRN: [id number]  Birthdate 1986-01-27  Date of evaluation: 09/03/18  PCP:  No primary care provider on file.    CHIEF COMPLAINT       Chief Complaint   Patient presents with   ??? Dysuria       HISTORY OF PRESENT ILLNESS  (Location/Symptom, Timing/Onset,Context/Setting, Quality, Duration, Modifying Factors, Severity.)      Cole FretChristopher Braatz is a 33 y.o. male who presents with dysuria as well as penile discharge.  Patient states that he is currently sexually active and does not always use protection.  Patient states the symptoms started within the last 2 days.  He would like to be checked for STDs and treated at this time.  Patient denies any abdominal pain, fevers, chills, nausea, vomiting, diarrhea constipation.  He denies any hematemesis or blood in his urine or stool.  Patient denies any penile pain or swelling or testicle pain or swelling.  He denies any genital wounds or history of herpes.    PAST MEDICAL /SURGICAL / SOCIAL / FAMILY HISTORY     Denies any past medical or surgical history at this time.    Social History     Socioeconomic History   ??? Marital status: Single     Spouse name: Not on file   ??? Number of children: Not on file   ??? Years of education: Not on file   ??? Highest education level: Not on file   Occupational History   ??? Not on file   Social Needs   ??? Financial resource strain: Not on file   ??? Food insecurity:     Worry: Not on file     Inability: Not on file   ??? Transportation needs:     Medical: Not on file     Non-medical: Not on file   Tobacco Use   ??? Smoking status: Never Smoker   ??? Smokeless tobacco: Never Used   Substance and Sexual Activity   ??? Alcohol use: Never     Frequency: Never   ??? Drug use: Never   ??? Sexual activity: Yes     Partners: Female   Lifestyle   ??? Physical activity:     Days per week: Not on file     Minutes per session: Not on file   ??? Stress: Not  on file   Relationships   ??? Social connections:     Talks on phone: Not on file     Gets together: Not on file     Attends religious service: Not on file     Active member of club or organization: Not on file     Attends meetings of clubs or organizations: Not on file     Relationship status: Not on file   ??? Intimate partner violence:     Fear of current or ex partner: Not on file     Emotionally abused: Not on file     Physically abused: Not on file     Forced sexual activity: Not on file   Other Topics Concern   ??? Not on file   Social History Narrative   ??? Not on file       History reviewed. No pertinent family history.    Allergies:  Patient has no known allergies.    Home Medications:  Prior to Admission medications    Medication Sig  Start Date End Date Taking? Authorizing Provider   ibuprofen (ADVIL;MOTRIN) 800 MG tablet Take 1 tablet by mouth every 8 hours as needed for Pain 07/16/18   Fabian Sharp, MD   acetaminophen (TYLENOL) 325 MG tablet Take 2 tablets by mouth every 6 hours as needed for Pain 03/02/18   Denton Ar, PA-C   ibuprofen (ADVIL;MOTRIN) 800 MG tablet Take 1 tablet by mouth every 8 hours as needed for Pain 03/02/18   Denton Ar, PA-C       patient's medication list has been reviewed as entered by the nursing staff.    REVIEW OF SYSTEMS    (2-9 systems for level 4, 10 or more for level 5)      Review of Systems   Constitutional: Negative for chills, diaphoresis, fatigue and fever.   HENT: Negative for facial swelling, trouble swallowing and voice change.    Eyes: Negative for pain.   Respiratory: Negative for chest tightness and shortness of breath.    Cardiovascular: Negative for chest pain and palpitations.   Gastrointestinal: Negative for abdominal distention, abdominal pain, anal bleeding, constipation, diarrhea, nausea, rectal pain and vomiting.   Genitourinary: Positive for discharge and dysuria. Negative for difficulty urinating, flank pain, frequency, genital sores,  hematuria, penile pain, penile swelling, scrotal swelling, testicular pain and urgency.   Musculoskeletal: Negative for arthralgias, back pain, myalgias, neck pain and neck stiffness.   Skin: Negative for rash and wound.   Neurological: Negative for headaches.   Hematological: Negative for adenopathy.   Psychiatric/Behavioral: Negative for confusion.       PHYSICAL EXAM  (up to 7 for level 4, 8 or more for level 5)      INITIAL VITALS:  height is 5\' 9"  (1.753 m) and weight is 193 lb (87.5 kg). His oral temperature is 98.2 ??F (36.8 ??C). His blood pressure is 148/84 (abnormal) and his pulse is 74. His respiration is 18 and oxygen saturation is 98%.      Physical Exam  Vitals signs and nursing note reviewed. Exam conducted with a chaperone present.   Constitutional:       General: He is not in acute distress.     Appearance: Normal appearance. He is well-developed. He is not ill-appearing or diaphoretic.   HENT:      Head: Normocephalic and atraumatic.   Eyes:      Conjunctiva/sclera: Conjunctivae normal.   Neck:      Musculoskeletal: Full passive range of motion without pain and normal range of motion.      Trachea: Trachea normal. No tracheal deviation.   Cardiovascular:      Rate and Rhythm: Normal rate.      Pulses: Normal pulses.   Pulmonary:      Effort: Pulmonary effort is normal. No respiratory distress.      Breath sounds: Normal breath sounds.   Abdominal:      General: Abdomen is flat. Bowel sounds are normal. There is no distension.      Palpations: Abdomen is soft. Abdomen is not rigid. There is no mass.      Tenderness: There is no abdominal tenderness. There is no right CVA tenderness, left CVA tenderness, guarding or rebound. Negative signs include Murphy's sign and McBurney's sign.   Genitourinary:     Penis: Normal and circumcised. No phimosis, paraphimosis, hypospadias, erythema, tenderness or discharge.       Scrotum/Testes: Normal.         Right: Tenderness or swelling not present.  Left:  Tenderness or swelling not present.   Musculoskeletal: Normal range of motion.   Lymphadenopathy:      Lower Body: No right inguinal adenopathy. No left inguinal adenopathy.   Skin:     General: Skin is warm and dry.      Capillary Refill: Capillary refill takes less than 2 seconds.      Findings: No bruising, ecchymosis or rash.   Neurological:      Mental Status: He is alert, oriented to person, place, and time and easily aroused.      Sensory: No sensory deficit.   Psychiatric:         Behavior: Behavior normal. Behavior is cooperative.         Thought Content: Thought content normal.         Judgment: Judgment normal.       PLAN (LABS / IMAGING / EKG):  Orders Placed This Encounter   Procedures   ??? C.trachomatis N.gonorrhoeae DNA, Urine   ??? Urinalysis with microscopic       MEDICATIONS ORDERED:  Orders Placed This Encounter   Medications   ??? cefTRIAXone (ROCEPHIN) injection 250 mg   ??? azithromycin (ZITHROMAX) tablet 1,000 mg       Controlled Substances Monitoring:       Differential Diagnoses:  Phimosis, paraphimosis, priapism, balanitis, posthitis, spermatocele, hydrocele, varicocele, epididymitis, orchitis, prostatitis, testicular torsion, testicular cancer, indirect inguinal hernia, fournier's gangrene, sexually transmitted infections.    DIAGNOSTIC RESULTS / EMERGENCY DEPARTMENT COURSE / MDM     Patient here today with reports of dysuria and penile discharge. Patient states that he is currently sexually active and does not use protection and would like to be checked for STDs. Patient understands that these results do not come back today and he will have to follow-up with either medical records or my chart.  He would like to be treated as well with azithromycin and Rocephin for chlamydia and gonorrhea.  Patient declines HIV or syphilis testing at this time.  Urine sample was obtained and patient was treated with antibiotics.  He was instructed return to ER with any worsening of symptoms including abdominal  pain, fevers, blood in his urine, nausea or vomiting.    RADIOLOGY:   I directly visualized (with the attending physician) the following  imagesand reviewed the radiologist interpretations:  No results found.    No orders to display       LABS:  Results for orders placed or performed during the hospital encounter of 09/03/18   Urinalysis with microscopic   Result Value Ref Range    Color, UA YELLOW YELLOW    Turbidity UA CLEAR CLEAR    Glucose, Ur NEGATIVE NEGATIVE    Bilirubin Urine NEGATIVE NEGATIVE    Ketones, Urine NEGATIVE NEGATIVE    Specific Gravity, UA 1.005 1.005 - 1.030    Urine Hgb TRACE (A) NEGATIVE    pH, UA 7.0 5.0 - 8.0    Protein, UA NEGATIVE NEGATIVE    Urobilinogen, Urine Normal Normal    Nitrite, Urine NEGATIVE NEGATIVE    Leukocyte Esterase, Urine MODERATE (A) NEGATIVE    -          WBC, UA 50 TO 100 0 - 5 /HPF    RBC, UA 2 TO 5 0 - 4 /HPF    Casts UA  0 - 8 /LPF     0 TO 2 HYALINE Reference range defined for non-centrifuged specimen.    Crystals UA NOT REPORTED None /  HPF    Epithelial Cells UA 0 TO 2 0 - 5 /HPF    Renal Epithelial, Urine NOT REPORTED 0 /HPF    Bacteria, UA NOT REPORTED None    Mucus, UA NOT REPORTED None    Trichomonas, UA NOT REPORTED None    Amorphous, UA NOT REPORTED None    Other Observations UA NOT REPORTED NOT REQ.    Yeast, UA NOT REPORTED None         CONSULTS:  None    PROCEDURES:  None    FINAL IMPRESSION      1. Dysuria    2. Possible exposure to STD          DISPOSITION / PLAN     DISPOSITION Decision To Discharge    PAT[facility]:  [facility]  [address]  [place] [id number]  [phone]  Go to   As needed, If symptoms worsen      DISCHARGE MEDICATIONS:  Discharge Medication List as of 09/03/2018 10:20 AM          Elwin Sleightourtney A Herberto Ledwell, APRN - CNP   Emergency Medicine Physician Assistant    (Please note that portions of this note were completed with a voice recognition program.  Efforts were made to edit thedictations but occasionally words  are mis-transcribed.)       Elwin Sleightourtney A Yoselyn Mcglade, APRN - CNP  09/03/18 1228

## 2018-09-03 NOTE — ED Provider Notes (Signed)
[facility] ED  eMERGENCY dEPARTMENT eNCOUnter   Independent Attestation     Pt Name: Cole Kent  MRN: [id number]  Birthdate 1985/10/13  Date of evaluation: 09/03/18       Scott Chessman is a 33 y.o. male who presents with Dysuria        I was personally available for consultation in the Emergency Department. Have reviewed everything on the chart that is available and agree with the documentation provided by the [facility], including discussion about the assessment, treatment plan and disposition.  Based on the medical record the care appears appropriate.        Salomon Mast, MD Lacie Scotts  Attending Emergency Medicine Physician     Salomon Mast, MD  09/03/18 [phone]

## 2018-09-03 NOTE — Discharge Instructions (Addendum)
Please follow-up with the primary care walk-in clinic.    Do not have any sexual intercourse until the test results (if obtained) are completed in 2 - 3 days.  Make sure you use condoms at all times.    Take your medication as indicated, if you are given an antibiotic then make sure you get the prescription filled and take the antibiotics until finished.  Drink plenty of water while taking the antibiotics.  Avoid drinking alcohol or drinks that have caffeine it in while taking antibiotics.     [facility], Derrill Kay has some antibiotics for free; [facility] and [address]ric Quan has a 4 dollar prescription plan for some antibiotics.    Return to the Emergency Department for worsening of symptoms, pain with urination, discharge from your penis, any other care or concern.

## 2018-09-05 LAB — C.TRACHOMATIS N.GONORRHOEAE DNA, URINE
C. trachomatis DNA ,Urine: NEGATIVE
N. gonorrhoeae DNA, Urine: POSITIVE — AB

## 2018-09-08 NOTE — Telephone Encounter (Signed)
CLINICAL PHARMACY NOTE:  Telephone Follow-up for Positive STD Test    At the time of Cole Kent visit to [facility] Emergency Department on 09/03/2018 STD testing was performed. DNA testing was positive for Gonorrhea.      While in the ED, patient was given azithromycin 1gm orally x1 dose and ceftriaxone 250mg  IM x 1 dose. Treatment appropriate, no follow up needed at this time.     Clair Gulling, RPh, CACP  Clinical Pharmacist Medication Management  09/08/2018  1:09 PM

## 2019-03-27 ENCOUNTER — Inpatient Hospital Stay
Admit: 2019-03-27 | Discharge: 2019-03-28 | Disposition: A | Discharge status: Home / Self Care | Payer: PRIVATE HEALTH INSURANCE | Attending: Emergency Medicine

## 2019-03-27 DIAGNOSIS — L02214 Cutaneous abscess of groin: Secondary | ICD-10-CM

## 2019-03-27 MED ORDER — HYDROCODONE-ACETAMINOPHEN 5-325 MG PO TABS
5-325 MG | ORAL_TABLET | Freq: Three times a day (TID) | ORAL | 0 refills | Status: AC | PRN
Start: 2019-03-27 — End: 2019-03-30

## 2019-03-27 MED ORDER — SULFAMETHOXAZOLE-TRIMETHOPRIM 800-160 MG PO TABS
800-160 MG | ORAL_TABLET | Freq: Two times a day (BID) | ORAL | 0 refills | Status: AC
Start: 2019-03-27 — End: 2019-04-06

## 2019-03-27 MED ORDER — CEPHALEXIN 500 MG PO CAPS
500 MG | ORAL_CAPSULE | Freq: Four times a day (QID) | ORAL | 0 refills | Status: AC
Start: 2019-03-27 — End: 2019-04-06

## 2019-03-27 NOTE — ED Provider Notes (Signed)
[facility]  eMERGENCY dEPARTMENTeNCOUnter      Pt Name: Cole Kent  MRN: [id number]  Birthdate [birth year]  Date ofevaluation: 03/27/2019  Provider: Raoul PitchMatthew Kshawn Joelyn Lover, PA-C    CHIEF COMPLAINT       Chief Complaint   Patient presents with   ??? Abscess         HISTORY OF PRESENT ILLNESS  (Location/Symptom, Timing/Onset, Context/Setting, Quality, Duration, Modifying Factors, Severity.)   Cole Kent is a 33 y.o. male who presents to the emergency department with painful tender area to the left inguinal area for the last week.  Area roughly the size of a dime.  No fevers or chills.  No nausea vomiting.  Patient has not tried any warm compresses at this time.  Pain worse with palpation.  No alleviating factors.      Nursing Notes were reviewed.    ALLERGIES     Patient has no known allergies.    CURRENT MEDICATIONS       Discharge Medication List as of 03/27/2019  7:58 PM      CONTINUE these medications which have NOT CHANGED    Details   !! ibuprofen (ADVIL;MOTRIN) 800 MG tablet Take 1 tablet by mouth every 8 hours as needed for Pain, Disp-30 tablet, R-0Print      acetaminophen (TYLENOL) 325 MG tablet Take 2 tablets by mouth every 6 hours as needed for Pain, Disp-30 tablet, R-0Print      !! ibuprofen (ADVIL;MOTRIN) 800 MG tablet Take 1 tablet by mouth every 8 hours as needed for Pain, Disp-20 tablet, R-0Print       !! - Potential duplicate medications found. Please discuss with provider.          PAST MEDICAL HISTORY   History reviewed. No pertinent past medical history.    SURGICAL HISTORY     History reviewed. No pertinent surgical history.      FAMILY HISTORY     History reviewed. No pertinent family history.  No family status information on file.        SOCIAL HISTORY      reports that he has never smoked. He has never used smokeless tobacco. He reports that he does not drink alcohol or use drugs.    REVIEW OFSYSTEMS    (2-9 systems for level 4, 10 or more for level 5)   Review of  Systems    Except as noted above the remainder of the review of systems was reviewed and negative.     PHYSICAL EXAM    (up to 7 for level 4, 8 or more for level 5)     ED Triage Vitals   BP Temp Temp src Pulse Resp SpO2 Height Weight   03/27/19 1836 03/27/19 1836 -- 03/27/19 1836 03/27/19 1836 03/27/19 1836 03/27/19 1835 03/27/19 1835   (!) 147/86 98 ??F (36.7 ??C)  85 14 97 % 5\' 9"  (1.753 m) 214 lb 14.4 oz (97.5 kg)      Physical Exam  Constitutional:       Appearance: He is well-developed.   HENT:      Head: Normocephalic and atraumatic.   Neck:      Musculoskeletal: Normal range of motion and neck supple.   Cardiovascular:      Rate and Rhythm: Normal rate and regular rhythm.   Pulmonary:      Effort: Pulmonary effort is normal.      Breath sounds: Normal breath sounds.   Abdominal:  Palpations: Abdomen is soft.   Genitourinary:      Musculoskeletal: Normal range of motion.   Skin:     General: Skin is warm.   Neurological:      Mental Status: He is alert and oriented to person, place, and time.   Psychiatric:         Behavior: Behavior normal.                 DIAGNOSTIC RESULTS     EKG: All EKG's are interpreted by the Emergency Department Physician who either signs or Co-signs this chart in the absence of a cardiologist.        RADIOLOGY:   Non-plain film images such as CT, Ultrasound and MRI are read by the radiologist. Plain radiographic images arevisualized and preliminarily interpreted by the emergency physician with the below findings:        Interpretation per the Radiologist below, if available at thetime of this note:          ED BEDSIDE ULTRASOUND:   Performed by ED Physician - none    LABS:  Labs Reviewed - No data to display    All other labs were within normal range or not returned as of this dictation.    EMERGENCY DEPARTMENT COURSE and DIFFERENTIAL DIAGNOSIS/MDM:   Vitals:    Vitals:    03/27/19 1835 03/27/19 1836   BP:  (!) 147/86   Pulse:  85   Resp:  14   Temp:  98 ??F (36.7 ??C)   SpO2:  97%    Weight: 214 lb 14.4 oz (97.5 kg)    Height: 5\' 9"  (1.753 m)      Warm compresses, anti-biotics, and pain medicine given.    Will dc home.      Offered I and D and patient declined.  Risk and benefits discussed.    CONSULTS:  None    PROCEDURES:  Procedures        FINAL IMPRESSION      1. Abscess          DISPOSITION/PLAN   DISPOSITION Decision To Discharge 03/27/2019 07:56:58 PM      PATIENTREFERRED TO:   No follow-up provider specified.    DISCHARGE MEDICATIONS:     Discharge Medication List as of 03/27/2019  7:58 PM      START taking these medications    Details   HYDROcodone-acetaminophen (NORCO) 5-325 MG per tablet Take 1-2 tablets by mouth every 8 hours as needed for Pain for up to 3 days., Disp-8 tablet,R-0Print      sulfamethoxazole-trimethoprim (BACTRIM DS) 800-160 MG per tablet Take 1 tablet by mouth 2 times daily for 10 days, Disp-20 tablet,R-0Print      cephALEXin (KEFLEX) 500 MG capsule Take 1 capsule by mouth 4 times daily for 10 days, Disp-40 capsule,R-0Print                 (Please note that portions of this note were completed with a voice recognition program.  Efforts were made to edit thedictations but occasionally words are mis-transcribed.)    Bethanie Dicker, PA-C            Bethanie Dicker, PA-C  03/27/19 [place], PA-C  03/27/19 2220

## 2019-03-27 NOTE — ED Notes (Signed)
Patient education flyer "[facility] Taking Antibiotics: What you need to know" was provided and reviewed.  Questions answered and understanding was verbalized by the patient and/or family.        Benjamine Sprague, RN  03/27/19 2011

## 2019-03-27 NOTE — Discharge Instructions (Addendum)
Take meds as prescribed.  Follow outpatient tomorrow with doctor or by calling [phone] or [phone].   Return to ER immediately if symptoms worsen or persist.  Use warm compresses at least 4 -5 times per day for 15 minutes.

## 2019-03-27 NOTE — ED Provider Notes (Signed)
The patient was seen and examined by me in conjunction with the mid-level provider.  I agree with his/her assessment and treatment plan.    He was advised to come back if symptoms worsen.     Cherie Ouch, MD  03/27/19 [phone]

## 2019-03-27 NOTE — ED Notes (Signed)
Pt presents to ED with reports of boil or abscess to his L inner thigh. Pt states discomfort with ambulation. Denies any other concerns at this time.      Geanie Kenning, RN  03/27/19 [phone]

## 2019-05-15 ENCOUNTER — Ambulatory Visit
Admit: 2019-05-15 | Discharge: 2019-05-15 | Payer: PRIVATE HEALTH INSURANCE | Attending: Emergency Medicine | Primary: Student in an Organized Health Care Education/Training Program

## 2019-05-15 ENCOUNTER — Inpatient Hospital Stay: Payer: PRIVATE HEALTH INSURANCE | Primary: Student in an Organized Health Care Education/Training Program

## 2019-05-15 DIAGNOSIS — A630 Anogenital (venereal) warts: Secondary | ICD-10-CM

## 2019-05-15 DIAGNOSIS — A64 Unspecified sexually transmitted disease: Secondary | ICD-10-CM

## 2019-05-15 LAB — POCT URINALYSIS DIPSTICK W/O MICROSCOPE (AUTO)
Bilirubin, UA: NEGATIVE
Blood, UA POC: NEGATIVE
Glucose, UA POC: NEGATIVE
Ketones, UA: NEGATIVE
Leukocytes, UA: NEGATIVE
Nitrite, UA: NEGATIVE
Protein, UA POC: NEGATIVE
Spec Grav, UA: 1.03
Urobilinogen, UA: 0.2
pH, UA: 6

## 2019-05-15 MED ORDER — CEPHALEXIN 500 MG PO CAPS
500 MG | ORAL_CAPSULE | Freq: Two times a day (BID) | ORAL | 0 refills | Status: AC
Start: 2019-05-15 — End: 2019-05-22

## 2019-05-15 MED ORDER — CLINDAMYCIN PHOSPHATE 1 % EX GEL
1 | CUTANEOUS | 1 refills | 30.00000 days | Status: AC
Start: 2019-05-15 — End: 2019-05-22

## 2019-05-15 MED ORDER — IMIQUIMOD 5 % EX CREA
5 % | CUTANEOUS | 1 refills | Status: AC
Start: 2019-05-15 — End: 2019-05-22

## 2019-05-15 MED ORDER — MELOXICAM 7.5 MG PO TABS
7.5 MG | ORAL_TABLET | Freq: Every day | ORAL | 3 refills | Status: DC
Start: 2019-05-15 — End: 2019-10-19

## 2019-05-15 NOTE — Progress Notes (Signed)
[facility]    Subjective:    Cole Kent is a 33 y.o. male with  has no past medical history on file.    History reviewed. No pertinent family history.    Presented to the office today for:  Chief Complaint   Patient presents with   ??? New Patient   ??? Health Maintenance     vaccines declined       HPI  CC: Establish Care     Pt is a 33 yr old African American male presenting today to establish care. Pt currently has complaints of chronic, painful groin lesions. Pt stated he has previously visited an urgent care for the lesions and was prescribed Valtrex and Norco. Pain is localized to lesions, is sharp in quality, does not radiate, and is a 5 out of 10 in severity. Pt states a hx of multiple sexual encounters without protection. Pt is requesting refills of meds. Pt is also requesting a referral for counseling due to depression. 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 Pt states he has decreased sleep and depressed mood. Denies suicidal ideations, decreased appetite, decreased interest in activities, and fatigue. Pt also complains of patches of dry skin on elbows and knees. Pt states he eats a "good" diet, doesn't use tobacco, rarely exercises, and doesn't use ETOH.     Review of Systems   Constitutional: Negative for activity change, appetite change, chills, diaphoresis, fatigue, fever and unexpected weight change.   Respiratory: Negative for apnea, cough, choking, chest tightness, shortness of breath, wheezing and stridor.    Cardiovascular: Negative for chest pain, palpitations and leg swelling.   Gastrointestinal: Negative for abdominal distention, abdominal pain, blood in stool, constipation, diarrhea, nausea, rectal pain and vomiting.   Genitourinary: Positive for frequency, genital sores and penile pain. Negative for decreased urine volume, difficulty urinating, discharge, dysuria, enuresis, flank pain, hematuria, penile swelling, scrotal swelling,  testicular pain and urgency.   Musculoskeletal: Negative for arthralgias, back pain, gait problem, joint swelling, myalgias, neck pain and neck stiffness.   Psychiatric/Behavioral: Positive for behavioral problems and sleep disturbance. Negative for decreased concentration, hallucinations, self-injury and suicidal ideas. The patient is not nervous/anxious and is not hyperactive.        Objective:    BP 125/78 (Site: Right Upper Arm, Position: Sitting, Cuff Size: Large Adult)    Pulse 95    Temp 97.3 ??F (36.3 ??C) (Temporal)    Ht 5\' 9"  (1.753 m)    Wt 218 lb (98.9 kg)    BMI 32.19 kg/m??    BP Readings from Last 3 Encounters:   05/15/19 125/78   03/27/19 (!) 147/86   09/03/18 (!) 148/84     Physical Exam  Exam conducted with a chaperone present.   Constitutional:       General: He is not in acute distress.     Appearance: Normal appearance. He is obese. He is not ill-appearing or diaphoretic.   HENT:      Head: Normocephalic and atraumatic.   Cardiovascular:      Pulses: Normal pulses.      Heart sounds: Normal heart sounds. No murmur. No friction rub. No gallop.    Pulmonary:      Effort: Pulmonary effort is normal. No respiratory distress.      Breath sounds: Normal breath sounds. No stridor. No wheezing, rhonchi or rales.   Chest:      Chest wall: No tenderness.   Abdominal:  General: Bowel sounds are normal. There is no distension.      Palpations: Abdomen is soft. There is no mass.      Tenderness: There is no abdominal tenderness. There is no guarding or rebound.      Hernia: No hernia is present. There is no hernia in the left inguinal area or right inguinal area.   Genitourinary:     Pubic Area: No rash or pubic lice.       Penis: Circumcised. Lesions present. No erythema, tenderness, discharge or swelling.       Scrotum/Testes: Normal.         Right: Mass, tenderness or swelling not present.         Left: Mass, tenderness or swelling not present.      Epididymis:      Right: Normal.      Left: Normal.    Neurological:      Mental Status: He is alert.   Psychiatric:         Mood and Affect: Mood normal.         Behavior: Behavior normal.         Thought Content: Thought content normal.         Judgment: Judgment normal.       No results found for: WBC, HGB, HCT, PLT, CHOL, TRIG, HDL, LDLDIRECT, ALT, AST, NA, K, CL, CREATININE, BUN, CO2, TSH, PSA, INR, GLUF, LABA1C, LABMICR  No results found for: CALCIUM, PHOS  No results found for: LDLCALC, LDLCHOLESTEROL, LDLDIRECT    Assessment:     1. Genital warts    2. Hidradenitis    3. Fatigue, unspecified type    4. STI (sexually transmitted infection)    5. Frequency of urination    6. Screening for prostate cancer        Plan:   1. Genital warts  - New  - Discussed prognosis, progression and concerns in regards to other partners   - imiquimod (ALDARA) 5 % cream; Apply topically three times a week.  Dispense: 1 box; Refill: 1    2. Hidradenitis  - Had problem for some time getting worse, multiple ED visits given Abx  - clindamycin (CLEOCIN-T) 1 % gel; Apply topically 2 times daily.  Dispense: 1 Tube; Refill: 1  - cephALEXin (KEFLEX) 500 MG capsule; Take 1 capsule by mouth 2 times daily for 7 days  Dispense: 14 capsule; Refill: 0  - meloxicam (MOBIC) 7.5 MG tablet; Take 1 tablet by mouth daily  Dispense: 30 tablet; Refill: 3    3. Fatigue, unspecified type  - New   - Basic Metabolic Panel; Future  - CBC With Auto Differential; Future  - TSH With Reflex Ft4; Future  - C-Reactive Protein; Future  - Hemoglobin A1C; Future    4. STI (sexually transmitted infection)  - Hx of multiple partners, and STIs  - Chlamydia/GC DNA, Urine; Future    5. Frequency of urination  - FHx of DM, denies burning, blood or discharge   - POCT Urinalysis No Micro (Auto)    6. Screening for prostate cancer  - Pt has no urinary sxs  - No FHx of prostate Ca  - Discussed risks and benefits of screening, currently declining testing       Declining vaccinations at this time, risk and benefits discussed      Cristal DeerChristopher received counseling on the following healthy behaviors: nutrition, exercise and medication adherence    Patient given educational materials : see patient  instruction     Discussed use, benefit, and side effects of prescribed medications.  Barriers to medicationcompliance addressed.      All patient questions answered.  Pt voiced understanding.   Medications Discontinued During This Encounter   Medication Reason   ??? cephALEXin (KEFLEX) 500 MG capsule Therapy completed   ??? acetaminophen (TYLENOL) 325 MG tablet Therapy completed   ??? ibuprofen (ADVIL;MOTRIN) 800 MG tablet Therapy completed   ??? ibuprofen (ADVIL;MOTRIN) 800 MG tablet Therapy completed   ??? sulfamethoxazole-trimethoprim (BACTRIM DS;SEPTRA DS) 800-160 MG per tablet Therapy completed   ??? sulfamethoxazole-trimethoprim (BACTRIM;SEPTRA) 200-40 MG/5ML suspension Therapy completed       Return in about 4 weeks (around 06/12/2019) for F/U Gen issue adn hidran.

## 2019-05-15 NOTE — Progress Notes (Signed)
 Visit Information    Have you changed or started any medications since your last visit including any over-the-counter medicines, vitamins, or herbal medicines? no   Have you stopped taking any of your medications? Is so, why? -  no  Are you having any side effects from any of your medications? - no    Have you seen any other physician or provider since your last visit?  no   Have you had any other diagnostic tests since your last visit?  no   Have you been seen in the emergency room and/or had an admission in a hospital since we last saw you?  no   Have you had your routine dental cleaning in the past 6 months?  no     Do you have an active MyChart account? If no, what is the barrier?  Yes    Patient Care Team:  Cole Arena, MD as PCP - General (Family Medicine)    Medical History Review  Past Medical, Family, and Social History reviewed and does not contribute to the patient presenting condition    Health Maintenance   Topic Date Due   . Varicella vaccine (1 of 2 - 2-dose childhood series) 08/04/1986   . HIV screen  08/04/2000   . DTaP/Tdap/Td vaccine (1 - Tdap) 08/04/2004   . Flu vaccine (1) 04/07/2019   . Hepatitis A vaccine  Aged Out   . Hepatitis B vaccine  Aged Out   . Hib vaccine  Aged Out   . Meningococcal (ACWY) vaccine  Aged Out   . Pneumococcal 0-64 years Vaccine  Aged Out

## 2019-05-15 NOTE — Patient Instructions (Signed)
Thank you for letting us take care of you today. We hope all your questions were addressed. If a question was overlooked or something else comes to mind after you return home, please contact a member of your Care Team listed below.      Your Care Team at [facility] is Team #1  Shabana Farooq, MD (Faculty)  Stefan Iacob,MD (Resident)  Benjamin Hake, MD (Resident)  Jaquena C,RMA  Misty K., LPN  Linda G, LPN  Sharon S. (Front office)  Patricia Ricard, RMA (Clinical Practice Manager)  Lisa McIntyre, RPH (Clinical Pharmacist)     Office phone number: [phone]    If you need to get in right away due to illness, please be advised we have "Same Day" appointments available Monday-Friday. Please call us at [phone] option #3 to schedule your "Same Day" appointment.

## 2019-05-19 NOTE — Progress Notes (Signed)
Attending Physician Statement    Wt Readings from Last 3 Encounters:   05/15/19 218 lb (98.9 kg)   03/27/19 214 lb 14.4 oz (97.5 kg)   09/03/18 193 lb (87.5 kg)     Temp Readings from Last 3 Encounters:   05/15/19 97.3 ??F (36.3 ??C) (Temporal)   03/27/19 98 ??F (36.7 ??C)   09/03/18 98.2 ??F (36.8 ??C) (Oral)     BP Readings from Last 3 Encounters:   05/15/19 125/78   03/27/19 (!) 147/86   09/03/18 (!) 148/84     Pulse Readings from Last 3 Encounters:   05/15/19 95   03/27/19 85   09/03/18 74         I have discussed the care of Cole Kent, including pertinent history and exam findings with the resident. I have reviewed the key elements of all parts of the encounter with the resident.  I agree with the assessment, plan and orders as documented by the resident.  (GE Modifier)

## 2019-05-21 LAB — C.TRACHOMATIS N.GONORRHOEAE DNA, URINE
C. trachomatis DNA ,Urine: NEGATIVE
N. gonorrhoeae DNA, Urine: NEGATIVE

## 2019-06-15 ENCOUNTER — Ambulatory Visit
Admit: 2019-06-15 | Discharge: 2019-06-15 | Payer: PRIVATE HEALTH INSURANCE | Attending: Emergency Medicine | Primary: Student in an Organized Health Care Education/Training Program

## 2019-06-15 ENCOUNTER — Inpatient Hospital Stay: Payer: PRIVATE HEALTH INSURANCE | Primary: Student in an Organized Health Care Education/Training Program

## 2019-06-15 DIAGNOSIS — L732 Hidradenitis suppurativa: Secondary | ICD-10-CM

## 2019-06-15 DIAGNOSIS — R5383 Other fatigue: Secondary | ICD-10-CM

## 2019-06-15 LAB — CBC WITH AUTO DIFFERENTIAL
Absolute Eos #: 0.16 10*3/uL (ref 0.00–0.44)
Absolute Immature Granulocyte: 0.03 10*3/uL (ref 0.00–0.30)
Absolute Lymph #: 1.93 10*3/uL (ref 1.10–3.70)
Absolute Mono #: 0.53 10*3/uL (ref 0.10–1.20)
Basophils Absolute: 0.04 10*3/uL (ref 0.00–0.20)
Basophils: 1 % (ref 0–2)
Eosinophils %: 2 % (ref 1–4)
Hematocrit: 44.6 % (ref 40.7–50.3)
Hemoglobin: 14.9 g/dL (ref 13.0–17.0)
Immature Granulocytes: 0 %
Lymphocytes: 26 % (ref 24–43)
MCH: 28.4 pg (ref 25.2–33.5)
MCHC: 33.4 g/dL (ref 28.4–34.8)
MCV: 85 fL (ref 82.6–102.9)
MPV: 11.2 fL (ref 8.1–13.5)
Monocytes: 7 % (ref 3–12)
NRBC Automated: 0 per 100 WBC
Platelets: 241 10*3/uL (ref 138–453)
RBC: 5.25 m/uL (ref 4.21–5.77)
RDW: 13.5 % (ref 11.8–14.4)
Seg Neutrophils: 64 % (ref 36–65)
Segs Absolute: 4.61 10*3/uL (ref 1.50–8.10)
WBC: 7.3 10*3/uL (ref 3.5–11.3)

## 2019-06-15 LAB — BASIC METABOLIC PANEL
Anion Gap: 13 mmol/L (ref 9–17)
BUN: 8 mg/dL (ref 6–20)
CO2: 26 mmol/L (ref 20–31)
Calcium: 9 mg/dL (ref 8.6–10.4)
Chloride: 103 mmol/L (ref 98–107)
Creatinine: 1.16 mg/dL (ref 0.70–1.20)
GFR African American: >60 mL/min (ref >60)
GFR Non-African American: >60 mL/min (ref >60)
Glucose: 92 mg/dL (ref 70–99)
Potassium: 4.2 mmol/L (ref 3.7–5.3)
Sodium: 142 mmol/L (ref 135–144)

## 2019-06-15 LAB — C-REACTIVE PROTEIN: CRP: 0.6 mg/L (ref 0.0–5.0)

## 2019-06-15 LAB — TSH WITH REFLEX: TSH: 1.03 mIU/L (ref 0.30–5.00)

## 2019-06-15 MED ORDER — EUCERIN EX CREA
PACK | CUTANEOUS | 1 refills | Status: DC
Start: 2019-06-15 — End: 2022-01-30

## 2019-06-15 NOTE — Progress Notes (Signed)
Visit Information    Have you changed or started any medications since your last visit including any over-the-counter medicines, vitamins, or herbal medicines? no   Have you stopped taking any of your medications? Is so, why? -  no  Are you having any side effects from any of your medications? - no    Have you seen any other physician or provider since your last visit?  no   Have you had any other diagnostic tests since your last visit?  no   Have you been seen in the emergency room and/or had an admission in a hospital since we last saw you?  no   Have you had your routine dental cleaning in the past 6 months?  no     Do you have an active MyChart account? If no, what is the barrier?  Yes    Patient Care Team:  Kristopher Oppenheim, MD as PCP - General (Family Medicine)    Medical History Review  Past Medical, Family, and Social History reviewed and does not contribute to the patient presenting condition    Health Maintenance   Topic Date Due   ??? HIV screen  08/04/2000   ??? Varicella vaccine (1 of 2 - 2-dose childhood series) 05/12/2020 (Originally 08/04/1986)   ??? DTaP/Tdap/Td vaccine (1 - Tdap) 05/14/2020 (Originally 08/04/2004)   ??? Flu vaccine (1) 05/14/2020 (Originally 04/07/2019)   ??? Hepatitis A vaccine  Aged Out   ??? Hepatitis B vaccine  Aged Out   ??? Hib vaccine  Aged Out   ??? Meningococcal (ACWY) vaccine  Aged Out   ??? Pneumococcal 0-64 years Vaccine  Aged Out

## 2019-06-15 NOTE — Progress Notes (Signed)
[place] [facility]   [facility]    Subjective:    Cole Kent is a 33 y.o. male with  has no past medical history on file.    No family history on file.    Presented to the office today for:  Chief Complaint   Patient presents with   ??? Follow-up       HPI    Hidradenitis   Improving with Cipro  No recent ED visits, no new abscess formations   However still with tender areas and some discomfort   Denies tobacco use  No F/C, N/V    Wants a psych referral  Reports he has difficulty with anger and anxiety but did not want to discuss it further with me  States he use to take a medication years ago but stopped because it made him feel like a "zombie" does not want to take meds  Believes self medication with marijuana helps; reports regular use   Denies thoughts of suicide or self harm, denies thoughts of homicide or hurting others     Dry skin   Also c/o dry skin on elbows and knees  Had for years   Maybe worse in winter but unsure  No cracking or discharge  Minimally itchy  Has tried lotion, not regularly, with little improvement       Review of Systems  Constitutional: Negative for activity change, appetite change, chills, diaphoresis, fatigue, fever and unexpected weight change.   HENT: Negative for congestion, rhinorrhea, sneezing, sore throat, trouble swallowing and voice change.    Eyes: Negative for pain and visual disturbance.   Respiratory: Negative for cough. Negative for shortness of breath, wheezing and stridor.    Cardiovascular: Negative for chest pain, palpitations and leg swelling.   Gastrointestinal: Negative for abdominal pain, constipation, diarrhea, nausea and vomiting.   Genitourinary: Negative for difficulty urinating and dysuria.   Musculoskeletal: Negative for arthralgias, gait problem, neck pain and neck stiffness.   Neurological: Negative for dizziness, seizures, syncope, weakness and headaches.    Objective:    BP 134/76    Pulse 71    Ht 5\' 9"  (1.753 m)    Wt  221 lb (100.2 kg)    BMI 32.64 kg/m??    BP Readings from Last 3 Encounters:   06/15/19 134/76   05/15/19 125/78   03/27/19 (!) 147/86     Physical Exam  Constitutional:       General: He is not in acute distress.     Appearance: Normal appearance.   HENT:      Head: Normocephalic and atraumatic.   Cardiovascular:      Rate and Rhythm: Normal rate and regular rhythm. No murmur   Pulmonary:      Effort: Pulmonary effort is normal. No respiratory distress.      Breath sounds: Normal breath sounds. No wheezing.   Abdominal:      General: Abdomen is flat. Bowel sounds are normal. There is no distension.      Palpations: Abdomen is soft.      Tenderness: There is no tenderness.   Musculoskeletal: Normal range of motion.      Right lower leg: No edema.      Left lower leg: No edema.   Skin:     General: Skin is warm and dry.      Dry scaly skin without discharge, erythema or warmth on BL elbows and knees      Declined GU exam  of hidradenitis at this time   Neurological:      General: No focal deficit present.      Mental Status: He is alert and oriented to person, place, and time.   Psychiatric:         Mood and Affect: Mood normal.     No results found for: WBC, HGB, HCT, PLT, CHOL, TRIG, HDL, LDLDIRECT, ALT, AST, NA, K, CL, CREATININE, BUN, CO2, TSH, PSA, INR, GLUF, LABA1C, LABMICR  No results found for: CALCIUM, PHOS  No results found for: LDLCALC, LDLCHOLESTEROL, LDLDIRECT    Assessment:     1. Hidradenitis    2. Flexural atopic dermatitis    3. Psychiatric complaint        Plan:   1. Hidradenitis  - Stable/ improving   - Continue Topical Cipro, with weight loss   - [place] - Heuring, [place], MD, Dermatology, [place]    2. Flexural atopic dermatitis  - Worsening, on elbows and knees; dry/ scaly   - Skin Protectants, Misc. (EUCERIN) cream; Apply topically as needed to flexural areas 2 times per day  Dispense: 1 Package; Refill: 1    3. Psychiatric complaint  - Unspecified; pt refusing to discuss in detail   - Advised to get  ahold of insurance for possible providers   - Appears with current understanding condition may be mood or personality related. Did state people make him angry and he manages but thinks he needs to talk to someone. Denies suicide or homicidal thoughts. Denies access to firearms        Reprinted Lab work that needs to be completed     Cordera received counseling on the following healthy behaviors: nutrition, exercise and medication adherence    Patient given educational materials : see patient instruction     Discussed use, benefit, and side effects of prescribed medications.  Barriers to medicationcompliance addressed.      All patient questions answered.  Pt voiced understanding.   There are no discontinued medications.    Return in about 2 months (around 08/15/2019) for F/U Hidratenitis/ dermatitis .

## 2019-06-16 LAB — HEMOGLOBIN A1C
Estimated Avg Glucose: 157 mg/dL
Hemoglobin A1C: 7.1 % — ABNORMAL HIGH (ref 4.0–6.0)

## 2019-06-16 LAB — HIV SCREEN: HIV Ag/Ab: NONREACTIVE

## 2019-06-16 NOTE — Progress Notes (Signed)
Attending Physician Statement    Wt Readings from Last 3 Encounters:   06/15/19 221 lb (100.2 kg)   05/15/19 218 lb (98.9 kg)   03/27/19 214 lb 14.4 oz (97.5 kg)     Temp Readings from Last 3 Encounters:   05/15/19 97.3 ??F (36.3 ??C) (Temporal)   03/27/19 98 ??F (36.7 ??C)   09/03/18 98.2 ??F (36.8 ??C) (Oral)     BP Readings from Last 3 Encounters:   06/15/19 134/76   05/15/19 125/78   03/27/19 (!) 147/86     Pulse Readings from Last 3 Encounters:   06/15/19 71   05/15/19 95   03/27/19 85         I have discussed the care of Cole Kent, including pertinent history and exam findings with the resident. I have reviewed the key elements of all parts of the encounter with the resident.  I agree with the assessment, plan and orders as documented by the resident.  (GE Modifier)

## 2019-06-29 ENCOUNTER — Inpatient Hospital Stay
Admit: 2019-06-29 | Discharge: 2019-06-29 | Disposition: A | Discharge status: Home / Self Care | Payer: PRIVATE HEALTH INSURANCE | Attending: Emergency Medicine

## 2019-06-29 DIAGNOSIS — L02214 Cutaneous abscess of groin: Secondary | ICD-10-CM

## 2019-06-29 LAB — HERPES SIMPLEX 1 & 2, MOLECULAR
HSV 1, NaaT: NEGATIVE
HSV 2, NaaT: NEGATIVE

## 2019-06-29 MED ORDER — CEPHALEXIN 500 MG PO CAPS
500 MG | ORAL_CAPSULE | Freq: Four times a day (QID) | ORAL | 0 refills | Status: AC
Start: 2019-06-29 — End: 2019-07-09

## 2019-06-29 MED ORDER — HYDROCODONE-ACETAMINOPHEN 5-325 MG PO TABS
5-325 MG | ORAL_TABLET | Freq: Three times a day (TID) | ORAL | 0 refills | Status: AC | PRN
Start: 2019-06-29 — End: 2019-07-02

## 2019-06-29 MED ORDER — VALACYCLOVIR HCL 1 G PO TABS
1 g | ORAL_TABLET | Freq: Three times a day (TID) | ORAL | 0 refills | Status: AC
Start: 2019-06-29 — End: 2019-07-06

## 2019-06-29 MED ORDER — SULFAMETHOXAZOLE-TRIMETHOPRIM 800-160 MG PO TABS
800-160 MG | ORAL_TABLET | Freq: Two times a day (BID) | ORAL | 0 refills | Status: AC
Start: 2019-06-29 — End: 2019-07-09

## 2019-06-29 NOTE — ED Provider Notes (Signed)
eMERGENCY dEPARTMENT eNCOUnter   Independent Attestation     Pt Name: Cole Kent  MRN: [id number]  Birthdate [birth year]  Date of evaluation: 06/29/19     [name] is a 33 y.o. male with CC: Abscess      Based on the medical record the care appears appropriate.  I was personally available for consultation in the Emergency Department.    Ethelene Browns, MD  Attending Emergency Physician                    Ethelene Browns, MD  06/29/19 [phone]

## 2019-06-29 NOTE — ED Provider Notes (Signed)
[facility] ED  eMERGENCY dEPARTMENTeNCOUnter      Pt Name: Cole Kent  MRN: [id number]  [place] 09-15-1985  Date ofevaluation: 06/29/2019  Provider: Bethanie Dicker, PA-C    CHIEF COMPLAINT       Chief Complaint   Patient presents with   ??? Abscess         HISTORY OF PRESENT ILLNESS  (Location/Symptom, Timing/Onset, Context/Setting, Quality, Duration, Modifying Factors, Severity.)   Cole Kent is a 33 y.o. male who presents to the emergency department with possible abscess to the left inguinal area near the testes.  Patient states he had been a reoccurring thing for a number of years.  States that he has been tested for herpes but they keep losing his blood work.  Patient states antibiotic is helping at this point.  No fevers or chills.  No nausea vomiting.  No penile discharge.  Patient states he has appointment with specialist coming up but did not for a number of months.      Nursing Notes were reviewed.    ALLERGIES     Patient has no known allergies.    CURRENT MEDICATIONS       Previous Medications    MELOXICAM (MOBIC) 7.5 MG TABLET    Take 1 tablet by mouth daily    SKIN PROTECTANTS, MISC. (EUCERIN) CREAM    Apply topically as needed to flexural areas 2 times per day       PAST MEDICAL HISTORY   History reviewed. No pertinent past medical history.    SURGICAL HISTORY     History reviewed. No pertinent surgical history.      FAMILY HISTORY     History reviewed. No pertinent family history.  No family status information on file.        SOCIAL HISTORY      reports that he has never smoked. He has never used smokeless tobacco. He reports current drug use. Drug: Marijuana. He reports that he does not drink alcohol.    REVIEW OFSYSTEMS    (2-9 systems for level 4, 10 or more for level 5)   Review of Systems    Except as noted above the remainder of the review of systems was reviewed and negative.     PHYSICAL EXAM    (up to 7 for level 4, 8 or more for level 5)     ED Triage Vitals  [06/29/19 0917]   BP Temp Temp Source Pulse Resp SpO2 Height Weight   -- 98.9 ??F (37.2 ??C) Oral 98 14 99 % 5\' 9"  (1.753 m) 223 lb (101.2 kg)      Physical Exam  Constitutional:       Appearance: He is well-developed.   HENT:      Head: Normocephalic and atraumatic.   Neck:      Musculoskeletal: Normal range of motion and neck supple.   Cardiovascular:      Rate and Rhythm: Normal rate and regular rhythm.   Pulmonary:      Effort: Pulmonary effort is normal.      Breath sounds: Normal breath sounds.   Abdominal:      Palpations: Abdomen is soft.   Genitourinary:      Musculoskeletal: Normal range of motion.   Skin:     General: Skin is warm.   Neurological:      Mental Status: He is alert and oriented to person, place, and time.   Psychiatric:  Behavior: Behavior normal.                 DIAGNOSTIC RESULTS     EKG: All EKG's are interpreted by the Emergency Department Physician who either signs or Co-signs this chart in the absence of a cardiologist.        RADIOLOGY:   Non-plain film images such as CT, Ultrasound and MRI are read by the radiologist. Plain radiographic images arevisualized and preliminarily interpreted by the emergency physician with the below findings:        Interpretation per the Radiologist below, if available at thetime of this note:          ED BEDSIDE ULTRASOUND:   Performed by ED Physician - none    LABS:  Labs Reviewed   HERPES SIMPLEX 1 & 2, MOLECULAR       All other labs were within normal range or not returned as of this dictation.    EMERGENCY DEPARTMENT COURSE and DIFFERENTIAL DIAGNOSIS/MDM:   Vitals:    Vitals:    06/29/19 0917   Pulse: 98   Resp: 14   Temp: 98.9 ??F (37.2 ??C)   TempSrc: Oral   SpO2: 99%   Weight: 223 lb (101.2 kg)   Height: 5\' 9"  (1.753 m)     Will treat with Bactrim and Keflex as patient has responded well to this in the past.  Also consider the possibility of herpes.  There is only 1 singular lesion.  Patient states that his doctor has been ordering blood test  for herpes but the laboratory keeps losing his blood work over the last several weeks he does not know the results.  Will cover with Valtrex as well.    CONSULTS:  None    PROCEDURES:  Procedures        FINAL IMPRESSION      1. Abscess          DISPOSITION/PLAN   DISPOSITION Decision To Discharge 06/29/2019 09:31:07 AM      PATIENTREFERRED TO:   07/01/2019, MD  [address] [place]  [phone]    In 3 days        DISCHARGE MEDICATIONS:     New Prescriptions    CEPHALEXIN (KEFLEX) 500 MG CAPSULE    Take 1 capsule by mouth 4 times daily for 10 days    HYDROCODONE-ACETAMINOPHEN (NORCO) 5-325 MG PER TABLET    Take 1-2 tablets by mouth every 8 hours as needed for Pain for up to 3 days.    SULFAMETHOXAZOLE-TRIMETHOPRIM (BACTRIM DS) 800-160 MG PER TABLET    Take 1 tablet by mouth 2 times daily for 10 days    VALACYCLOVIR (VALTREX) 1 G TABLET    Take 1 tablet by mouth 3 times daily for 7 days           (Please note that portions of this note were completed with a voice recognition program.  Efforts were made to edit thedictations but occasionally words are mis-transcribed.)    [phone], PA-C            Raoul Pitch, PA-C  06/29/19 [phone]

## 2019-06-29 NOTE — Discharge Instructions (Addendum)
Take meds as prescribed.  Follow up with doctor  in 3 -4 days.  Return to ER immediately if symptoms worsen or persist or norco.

## 2019-09-15 NOTE — Telephone Encounter (Signed)
Error

## 2019-10-19 ENCOUNTER — Ambulatory Visit
Admit: 2019-10-19 | Discharge: 2019-10-19 | Payer: PRIVATE HEALTH INSURANCE | Attending: Dermatology | Primary: Student in an Organized Health Care Education/Training Program

## 2019-10-19 DIAGNOSIS — L732 Hidradenitis suppurativa: Secondary | ICD-10-CM

## 2019-10-19 MED ORDER — BENZOYL PEROXIDE 5 % EX LIQD
5 % | CUTANEOUS | 3 refills | Status: DC
Start: 2019-10-19 — End: 2020-04-08

## 2019-10-19 MED ORDER — CLINDAMYCIN PHOSPHATE 1 % EX LOTN
1 % | CUTANEOUS | 3 refills | Status: DC
Start: 2019-10-19 — End: 2020-04-08

## 2019-10-19 MED ORDER — CERAVE EX CREA
CUTANEOUS | 2 refills | Status: DC
Start: 2019-10-19 — End: 2020-04-08

## 2019-11-25 ENCOUNTER — Inpatient Hospital Stay
Admit: 2019-11-25 | Discharge: 2019-11-25 | Disposition: A | Discharge status: Home / Self Care | Payer: PRIVATE HEALTH INSURANCE | Attending: Emergency Medicine

## 2019-11-25 DIAGNOSIS — L02415 Cutaneous abscess of right lower limb: Secondary | ICD-10-CM

## 2019-11-25 MED ORDER — SULFAMETHOXAZOLE-TRIMETHOPRIM 800-160 MG PO TABS
800-160 MG | ORAL_TABLET | Freq: Two times a day (BID) | ORAL | 0 refills | Status: AC
Start: 2019-11-25 — End: 2019-12-05

## 2019-11-25 MED ORDER — ACETAMINOPHEN-CODEINE 300-30 MG PO TABS
300-30 MG | ORAL_TABLET | Freq: Three times a day (TID) | ORAL | 0 refills | Status: AC | PRN
Start: 2019-11-25 — End: 2019-11-30

## 2019-11-25 MED ORDER — CEPHALEXIN 500 MG PO CAPS
500 MG | ORAL_CAPSULE | Freq: Four times a day (QID) | ORAL | 0 refills | Status: AC
Start: 2019-11-25 — End: 2019-12-05

## 2019-11-25 MED ORDER — IBUPROFEN 800 MG PO TABS
800 MG | ORAL_TABLET | Freq: Three times a day (TID) | ORAL | 0 refills | Status: DC | PRN
Start: 2019-11-25 — End: 2020-05-06

## 2019-11-25 NOTE — Telephone Encounter (Signed)
Pt walked into the office today. Stating "he was told to walk in anytime" if he had a flare up with HS. Writer asked Dr.Heuring if she had time to squeeze him in her double booked schedule today but unfortunately we could not as she was already behind. Writer offered to schedule for an appt but pt declined and got up from his seat and raised his voice and began to swear at the front desk staff and patient's in the waiting room as he was exiting out of the office and states "Ill just go to the hospital."

## 2019-11-26 NOTE — Telephone Encounter (Signed)
Attempted to call pt to schedule an ED f/u appt, phone rang and hung up

## 2020-01-25 ENCOUNTER — Encounter: Attending: Dermatology | Primary: Student in an Organized Health Care Education/Training Program

## 2020-04-08 ENCOUNTER — Ambulatory Visit
Admit: 2020-04-08 | Discharge: 2020-04-08 | Payer: PRIVATE HEALTH INSURANCE | Attending: Student in an Organized Health Care Education/Training Program | Primary: Student in an Organized Health Care Education/Training Program

## 2020-04-08 ENCOUNTER — Encounter

## 2020-04-08 ENCOUNTER — Inpatient Hospital Stay: Payer: PRIVATE HEALTH INSURANCE | Primary: Student in an Organized Health Care Education/Training Program

## 2020-04-08 DIAGNOSIS — L732 Hidradenitis suppurativa: Secondary | ICD-10-CM

## 2020-04-08 DIAGNOSIS — E119 Type 2 diabetes mellitus without complications: Secondary | ICD-10-CM

## 2020-04-08 LAB — LIPID PANEL
Chol/HDL Ratio: 3.5 (ref <5)
Cholesterol: 170 mg/dL (ref <200)
HDL: 49 mg/dL (ref >40)
LDL Cholesterol: 51 mg/dL (ref 0–130)
Triglycerides: 350 mg/dL — ABNORMAL HIGH (ref <150)

## 2020-04-08 LAB — HEPATITIS C ANTIBODY: Hepatitis C Ab: NONREACTIVE

## 2020-04-08 MED ORDER — METFORMIN HCL 500 MG PO TABS
500 MG | ORAL_TABLET | Freq: Two times a day (BID) | ORAL | 1 refills | Status: DC
Start: 2020-04-08 — End: 2020-09-13

## 2020-04-08 MED ORDER — EMOLLIENT BASE EX CREA
CUTANEOUS | 3 refills | Status: DC
Start: 2020-04-08 — End: 2022-01-30

## 2020-04-08 MED ORDER — IBUPROFEN 600 MG PO TABS
600 MG | ORAL_TABLET | Freq: Three times a day (TID) | ORAL | 0 refills | Status: DC | PRN
Start: 2020-04-08 — End: 2020-05-06

## 2020-04-08 MED ORDER — CLINDAMYCIN HCL 300 MG PO CAPS
300 MG | ORAL_CAPSULE | Freq: Two times a day (BID) | ORAL | 0 refills | Status: AC
Start: 2020-04-08 — End: 2020-04-15

## 2020-04-09 LAB — HEMOGLOBIN A1C
Estimated Avg Glucose: 123 mg/dL
Hemoglobin A1C: 5.9 % (ref 4.0–6.0)

## 2020-04-12 NOTE — Telephone Encounter (Signed)
Refill request       [facility] (CER[facility]) CREA  ??  Preferred pharmacy: [facility] [address] [place] - [address] - [place] [phone] [place] [phone]  Future Appointments   Date Time Provider Department Center   05/06/2020  2:30 PM Jonelle Sports, MD [facility]   08/31/2020 11:15 AM Lenon Oms, MD [id number]     Last appointment: 10/19/19

## 2020-04-12 NOTE — Telephone Encounter (Signed)
Refill request  benzoyl peroxide 5 % external liquid  clindamycin (CLEOCIN T) 1 % lotion  Emollient (CERAVE) CREA  Future Appointments   Date Time Provider Department Center   05/06/2020  2:30 PM Jonelle Sports, MD [facility]   08/31/2020 11:15 AM Lenon Oms, MD [facility]     Last appointment: 10/19/19

## 2020-04-13 MED ORDER — CLINDAMYCIN PHOSPHATE 1 % EX LOTN
1 % | CUTANEOUS | 3 refills | Status: DC
Start: 2020-04-13 — End: 2020-06-07

## 2020-04-13 MED ORDER — CERAVE EX CREA
CUTANEOUS | 2 refills | Status: DC
Start: 2020-04-13 — End: 2020-06-07

## 2020-04-13 MED ORDER — BENZOYL PEROXIDE 5 % EX LIQD
5 % | CUTANEOUS | 3 refills | Status: DC
Start: 2020-04-13 — End: 2020-08-31

## 2020-05-06 ENCOUNTER — Ambulatory Visit
Admit: 2020-05-06 | Discharge: 2020-05-06 | Payer: PRIVATE HEALTH INSURANCE | Attending: Student in an Organized Health Care Education/Training Program | Primary: Student in an Organized Health Care Education/Training Program

## 2020-05-06 DIAGNOSIS — L732 Hidradenitis suppurativa: Secondary | ICD-10-CM

## 2020-05-06 MED ORDER — ACETAMINOPHEN 500 MG PO TABS
500 MG | ORAL_TABLET | Freq: Three times a day (TID) | ORAL | 0 refills | Status: DC | PRN
Start: 2020-05-06 — End: 2021-05-02

## 2020-05-06 NOTE — Progress Notes (Signed)
Cole Kent (DOB:  [birth year]) is a 34 y.o. male,Established patient, here for evaluation of the following chief complaint(s):  1 Month Follow-Up (ibuprofen not helping at all - no symptom releif )    ASSESSMENT/PLAN:  1. Hidradenitis  -     AFL - Mutgi, Mellody Drown, MD Dermatology, [place]  -     [place] - Helene Kelp, MD, Pain Management, [place]  -     acetaminophen (TYLENOL) 500 MG tablet; Take 2 tablets by mouth 3 times daily as needed for Pain, Disp-180 tablet, R-0Normal  -Patient advised to continue same treatment of benzoyl peroxide, clindamycin ointment, Biafine cream and to follow-up with [place] in 3 months.    Return in about 3 months (around 08/06/2020).       Subjective   SUBJECTIVE/OBJECTIVE:    HPI    Was for, 34 year old male, presenting to the clinic today for follow-up on hidradenitis suppurativa.  Last seen 1 month ago at our clinic.  Was discharged on clindamycin ointment, benzoyl peroxide wash, and biafine cream.    Presents to the clinic today with unresolving symptoms.  Patient reports minimal improvement.  Patient maintains that he is following a healthy hygiene showering daily and keeping area of rash around groin clean.     No oozing discharge.  No blood.  No fever.  No trauma.    Review of systems negative.    Review of Systems   Constitutional: Negative for activity change, appetite change and fever.   HENT: Negative for congestion and sore throat.    Eyes: Negative for pain and visual disturbance.   Respiratory: Negative for cough, shortness of breath and wheezing.    Cardiovascular: Negative for chest pain and leg swelling.   Gastrointestinal: Negative for abdominal pain, constipation, diarrhea, nausea and vomiting.   Endocrine: Negative.    Genitourinary: Negative for difficulty urinating and dysuria.   Musculoskeletal: Negative.    Skin: Positive for rash (Upper medial thigh proximal to groin region bilateral.).   Allergic/Immunologic: Negative.    Neurological: Negative for syncope,  weakness and headaches.          Objective   Physical Exam  Vitals and nursing note reviewed.   Constitutional:       General: He is not in acute distress.     Appearance: Normal appearance. He is not ill-appearing or toxic-appearing.   HENT:      Nose: Nose normal.      Mouth/Throat:      Mouth: Mucous membranes are moist.      Pharynx: Oropharynx is clear.   Eyes:      Extraocular Movements: Extraocular movements intact.   Cardiovascular:      Rate and Rhythm: Regular rhythm.      Heart sounds: Normal heart sounds. No murmur heard.     Pulmonary:      Effort: No respiratory distress.      Breath sounds: Normal breath sounds. No wheezing.   Abdominal:      Palpations: Abdomen is soft.      Tenderness: There is no abdominal tenderness. There is no guarding or rebound.   Musculoskeletal:         General: No swelling or tenderness.   Skin:     Findings: Rash (Bilateral upper medial thighs proximal to groin region.  1 to 2 cm lesion bilaterally.  Erythematous.  Tender.  No discharge.  Raised.  Indurated.) present.   Neurological:      General: No focal deficit present.  Mental Status: He is alert and oriented to person, place, and time.       On this date 05/06/2020 I have spent 40 minutes reviewing previous notes, test results and face to face with the patient discussing the diagnosis and importance of compliance with the treatment plan as well as documenting on the day of the visit.    An electronic signature was used to authenticate this note.    --Nyoka Cowden, MD

## 2020-05-06 NOTE — Progress Notes (Signed)
Visit Information    Have you changed or started any medications since your last visit including any over-the-counter medicines, vitamins, or herbal medicines? no   Have you stopped taking any of your medications? Is so, why? -  no  Are you having any side effects from any of your medications? - no    Have you seen any other physician or provider since your last visit?  no   Have you had any other diagnostic tests since your last visit?  no   Have you been seen in the emergency room and/or had an admission in a hospital since we last saw you?  no   Have you had your routine dental cleaning in the past 6 months?  no     Do you have an active MyChart account? If no, what is the barrier?  yes    Patient Care Team:  Riesa Pope, MD as PCP - General (Family Medicine)    Medical History Review  Past Medical, Family, and Social History reviewed and does contribute to the patient presenting condition    Health Maintenance   Topic Date Due   ??? Pneumococcal 0-64 years Vaccine (1 of 2 - PPSV23) Never done   ??? Diabetic foot exam  Never done   ??? Diabetic retinal exam  Never done   ??? COVID-19 Vaccine (1) Never done   ??? Diabetic microalbuminuria test  Never done   ??? Hepatitis B vaccine (1 of 3 - Risk 3-dose series) Never done   ??? Flu vaccine (1) Never done   ??? Varicella vaccine (1 of 2 - 2-dose childhood series) 05/12/2020 (Originally 08/04/1986)   ??? DTaP/Tdap/Td vaccine (1 - Tdap) 05/14/2020 (Originally 08/04/2004)   ??? A1C test (Diabetic or Prediabetic)  04/08/2021   ??? Lipid screen  04/08/2021   ??? Hepatitis C screen  Completed   ??? HIV screen  Completed   ??? Hepatitis A vaccine  Aged Out   ??? Hib vaccine  Aged Out   ??? Meningococcal (ACWY) vaccine  Aged Out

## 2020-05-06 NOTE — Patient Instructions (Signed)
Patient Education        Hidradenitis Suppurativa: Care Instructions  Your Care Instructions     Hidradenitis suppurativa (say "hih-drad-uh-NY-tus sup-yur-uh-TY-vuh") is a skin condition that causes lumps on the skin that look like pimples or boils. The lumps are usually painful and can break open and drain blood and bad-smelling pus. The condition can come and go for many years.  Treatment for this condition may include antibiotics and other medicines. You may need surgery to remove the lumps. Home care includes wearing loose-fitting clothes and washing the area gently. You can help prevent lumps from coming back by staying at a healthy weight and not smoking.  Doctors don't know exactly how this condition starts. But they do know that something irritates and inflames the hair follicles, causing them to swell and form lumps. This skin condition can't be spread from person to person (isn't contagious).  Follow-up care is a key part of your treatment and safety. Be sure to make and go to all appointments, and call your doctor if you are having problems. It's also a good idea to know your test results and keep a list of the medicines you take.  How can you care for yourself at home?  Skin care  ?? ?? Wash the area every day with mild soap. Use your hands rather than a washcloth or sponge when you wash that part of your body.   ?? ?? Leave the affected areas uncovered when you can. If you have lumps that are draining, you can cover them with a bandage or other dressing. Put petroleum jelly (such as Vaseline) on the dressing to help keep it from sticking.   ?? ?? Wear-loose fitting clothes that don't rub against the area. Avoid activities that cause skin to rub together.   ?? ?? If you have pain, try a warm compress. Soak a towel or washcloth in warm water, wring it out, and place it on the affected skin for about 10 minutes.   Medicines  ?? ?? Be safe with medicines. Take your medicines exactly as prescribed. Call your doctor  if you think you are having a problem with your medicine. You will get more details on the specific medicines your doctor prescribes.   ?? ?? If your doctor prescribed antibiotics, take them as directed. Do not stop taking them just because you feel better. You need to take the full course of antibiotics.   Lifestyle choices  ?? ?? If you smoke, think about quitting. Smoking can make the condition worse. If you need help quitting, talk to your doctor about stop-smoking programs and medicines. These can increase your chances of quitting for good.   ?? ?? Stay at a healthy weight, or lose weight, by eating healthy foods and being physically active. Being overweight could make this condition worse.   When should you call for help?   Call your doctor now or seek immediate medical care if:  ?? ?? You have symptoms of infection, such as:  ? Increased pain, swelling, warmth, or redness.  ? Red streaks leading from the area.  ? Pus draining from the area.  ? A fever.   Watch closely for changes in your health, and be sure to contact your doctor if:  ?? ?? You do not get better as expected.   Where can you learn more?  Go to [email or web address] and sign in to your MyChart account. Enter N962 in the Search Health Information box to learn more about "Hidradenitis   Suppurativa: Care Instructions."     If you do not have an account, please click on the "Sign Up Now" link.  Current as of: October 07, 2019??????????????????????????????Content Version: 13.0  ?? 2006-2021 [employer].   Care instructions adapted under license by [facility]. If you have questions about a medical condition or this instruction, always ask your healthcare professional. [employer], Inc[employer] disclaims any warranty or liability for your use of this information.

## 2020-05-06 NOTE — Progress Notes (Signed)
Attending Physician Statement  I have discussed the care of Cole Kent 34 y.o. male, including pertinent history and exam findings, with the resident Dr. Adelene Amas, MD.    History and Exam:   Chief Complaint   Patient presents with   ??? 1 Month Follow-Up     ibuprofen not helping at all - no symptom releif        No past medical history on file.  No Known Allergies   I have seen and examined the patient and the key elements of the encounter have been performed by me.  BP Readings from Last 3 Encounters:   05/06/20 (!) 140/95   04/08/20 (!) 140/87   11/25/19 (!) 148/95     BP (!) 140/95    Pulse 96    Wt 230 lb 9.6 oz (104.6 kg)    BMI 34.05 kg/m??   Lab Results   Component Value Date    WBC 7.3 06/15/2019    HGB 14.9 06/15/2019    HCT 44.6 06/15/2019    PLT 241 06/15/2019    CHOL 170 04/08/2020    TRIG 350 (H) 04/08/2020    HDL 49 04/08/2020    NA 142 06/15/2019    K 4.2 06/15/2019    CL 103 06/15/2019    CREATININE 1.16 06/15/2019    BUN 8 06/15/2019    CO2 26 06/15/2019    TSH 1.03 06/15/2019    LABA1C 5.9 04/08/2020     No results found for: LABPROT, LABALBU  No results found for: IRON, TIBC, FERRITIN  Lab Results   Component Value Date    LDLCHOLESTEROL 51 04/08/2020     I agree with the assessment, plan and the diagnosis of    Diagnosis Orders   1. Hidradenitis  AFL - Kai Levins, MD Dermatology, [facility] Helene Kelp, MD, Pain Management, [facility]    acetaminophen (TYLENOL) 500 MG tablet    . I agree with orders as documented by the resident.     More than 25 minutes spent  in face to face encounter with the patient and more than half in counseling. Patient's questions were answered.   Patient Voiced understanding to the counseling.  Return in about 3 months (around 08/06/2020).   (GC Modifier)-Dr. Ria Clock, MD

## 2020-06-02 ENCOUNTER — Encounter

## 2020-06-07 ENCOUNTER — Encounter

## 2020-06-08 MED ORDER — CERAVE EX CREA
CUTANEOUS | 2 refills | Status: DC
Start: 2020-06-08 — End: 2020-09-01

## 2020-06-08 MED ORDER — CLINDAMYCIN PHOSPHATE 1 % EX LOTN
1 % | CUTANEOUS | 3 refills | Status: DC
Start: 2020-06-08 — End: 2020-08-17

## 2020-06-08 NOTE — Telephone Encounter (Signed)
Cole Kent is requesting a refill on the following medications:   Requested Prescriptions     Pending Prescriptions Disp Refills   ??? clindamycin (CLEOCIN T) 1 % lotion 60 mL 3     Sig: Apply to affected areas daily   ??? Emollient (CERAVE) CREA 453 g 2     Sig: Apply twice daily as moisturizer to full skin       Last OV 10/19/19    Future Appointments   Date Time Provider Department Center   08/31/2020 11:15 AM Lenon Oms, MD [facility]

## 2020-07-11 ENCOUNTER — Inpatient Hospital Stay
Admit: 2020-07-11 | Discharge: 2020-07-11 | Disposition: A | Discharge status: Home / Self Care | Payer: PRIVATE HEALTH INSURANCE | Attending: Emergency Medicine

## 2020-07-11 DIAGNOSIS — U071 COVID-19: Secondary | ICD-10-CM

## 2020-07-11 LAB — CBC WITH AUTO DIFFERENTIAL
Absolute Eos #: <0.03 10*3/uL (ref 0.00–0.44)
Absolute Immature Granulocyte: 0.01 10*3/uL (ref 0.00–0.30)
Absolute Lymph #: 1.15 10*3/uL (ref 1.10–3.70)
Absolute Mono #: 0.39 10*3/uL (ref 0.10–1.20)
Basophils Absolute: <0.03 10*3/uL (ref 0.00–0.20)
Basophils: 1 % (ref 0–2)
Eosinophils %: 0 % — ABNORMAL LOW (ref 1–4)
Hematocrit: 43.6 % (ref 40.7–50.3)
Hemoglobin: 15.1 g/dL (ref 13.0–17.0)
Immature Granulocytes: 0 %
Lymphocytes: 29 % (ref 24–43)
MCH: 28.3 pg (ref 25.2–33.5)
MCHC: 34.6 g/dL (ref 28.4–34.8)
MCV: 81.8 fL — ABNORMAL LOW (ref 82.6–102.9)
MPV: 10.7 fL (ref 8.1–13.5)
Monocytes: 10 % (ref 3–12)
NRBC Automated: 0 per 100 WBC
Platelets: 189 10*3/uL (ref 138–453)
RBC: 5.33 m/uL (ref 4.21–5.77)
RDW: 13.2 % (ref 11.8–14.4)
Seg Neutrophils: 60 % (ref 36–65)
Segs Absolute: 2.43 10*3/uL (ref 1.50–8.10)
WBC: 4 10*3/uL (ref 3.5–11.3)

## 2020-07-11 LAB — BASIC METABOLIC PANEL
Anion Gap: 13 mmol/L (ref 9–17)
BUN: 12 mg/dL (ref 6–20)
Bun/Cre Ratio: 10 (ref 9–20)
CO2: 26 mmol/L (ref 20–31)
Calcium: 8.6 mg/dL (ref 8.6–10.4)
Chloride: 98 mmol/L (ref 98–107)
Creatinine: 1.26 mg/dL — ABNORMAL HIGH (ref 0.70–1.20)
GFR African American: >60 mL/min (ref >60)
GFR Non-African American: >60 mL/min (ref >60)
Glucose: 99 mg/dL (ref 70–99)
Potassium: 3.5 mmol/L — ABNORMAL LOW (ref 3.7–5.3)
Sodium: 137 mmol/L (ref 135–144)

## 2020-07-11 MED ORDER — ONDANSETRON 4 MG PO TBDP
4 MG | ORAL_TABLET | Freq: Three times a day (TID) | ORAL | 0 refills | Status: DC | PRN
Start: 2020-07-11 — End: 2022-01-30

## 2020-07-11 MED ORDER — IBUPROFEN 800 MG PO TABS
800 MG | ORAL_TABLET | Freq: Three times a day (TID) | ORAL | 0 refills | Status: DC | PRN
Start: 2020-07-11 — End: 2022-01-30

## 2020-07-11 MED ORDER — SODIUM CHLORIDE 0.9 % IV BOLUS
0.9 % | Freq: Once | INTRAVENOUS | Status: AC
Start: 2020-07-11 — End: 2020-07-11
  Administered 2020-07-11: 20:00:00 1000 mL via INTRAVENOUS

## 2020-07-11 MED ORDER — BENZONATATE 100 MG PO CAPS
100 MG | ORAL_CAPSULE | Freq: Three times a day (TID) | ORAL | 0 refills | Status: AC | PRN
Start: 2020-07-11 — End: 2020-07-18

## 2020-07-11 NOTE — ED Provider Notes (Signed)
[facility]  [facility] ED  eMERGENCY dEPARTMENT eNCOUnter      Pt Name: Cole Kent  MRN: [id number]  Birthdate November 06, 1985  Date of evaluation: 07/11/2020  Provider: Carolyn Stare, APRN - CNP    CHIEF COMPLAINT       Chief Complaint   Patient presents with   ??? Emesis   ??? Concern For COVID-19     symptoms since last thursday         HISTORY OF PRESENT ILLNESS  (Location/Symptom, Timing/Onset, Context/Setting, Quality, Duration, Modifying Factors, Severity.)   Cole Kent is a 34 y.o. male who presents to the emergency department via private auto for cough, chills, fatigue, N/V/D, body aches, loss taste/smell. Onset was Thursday 07/07/20. Rates his pain 10/10 at this time. Denies known ill contacts.       Nursing Notes were reviewed.    ALLERGIES     Patient has no known allergies.    CURRENT MEDICATIONS       Previous Medications    ACETAMINOPHEN (TYLENOL) 500 MG TABLET    Take 2 tablets by mouth 3 times daily as needed for Pain    BENZOYL PEROXIDE 5 % EXTERNAL LIQUID    Wash affected areas once daily    CLINDAMYCIN (CLEOCIN T) 1 % LOTION    Apply to affected areas daily    EMOLLIENT (BIAFINE) CREAM    Apply topically as needed.    EMOLLIENT (CERAVE) CREA    Apply twice daily as moisturizer to full skin    METFORMIN (GLUCOPHAGE) 500 MG TABLET    Take 1 tablet by mouth 2 times daily (with meals)    SKIN PROTECTANTS, MISC. (EUCERIN) CREAM    Apply topically as needed to flexural areas 2 times per day       PAST MEDICAL HISTORY   History reviewed. No pertinent past medical history.    SURGICAL HISTORY     History reviewed. No pertinent surgical history.      FAMILY HISTORY     History reviewed. No pertinent family history.  No family status information on file.        SOCIAL HISTORY      reports that he has never smoked. He has never used smokeless tobacco. He reports current drug use. Drug: Marijuana Sheran Fava). He reports that he does not drink alcohol.    REVIEW OF SYSTEMS    (2-9 systems for level 4, 10 or  more for level 5)     Review of Systems   Constitutional: Positive for chills, fatigue and fever. Negative for diaphoresis.   HENT: Positive for congestion. Negative for ear discharge, ear pain, postnasal drip, rhinorrhea, sinus pressure and sore throat.    Respiratory: Positive for cough. Negative for shortness of breath.    Cardiovascular: Negative for chest pain and palpitations.   Gastrointestinal: Positive for diarrhea, nausea and vomiting. Negative for abdominal pain.   Genitourinary: Negative for dysuria.   Musculoskeletal: Positive for arthralgias and myalgias. Negative for neck pain and neck stiffness.   Skin: Negative for color change and rash.   Neurological: Negative for dizziness, weakness, light-headedness and headaches.     Except as noted above the remainder of the review of systems was reviewed and negative.     PHYSICAL EXAM    (up to 7 for level 4, 8 or more for level 5)     ED Triage Vitals [07/11/20 1343]   BP Temp Temp Source Pulse Resp SpO2 Height Weight  125/89 97.9 ??F (36.6 ??C) Oral 94 20 98 % 5\' 9"  (1.753 m) 226 lb 6.4 oz (102.7 kg)     Physical Exam  Vitals reviewed.   Constitutional:       General: He is not in acute distress.     Appearance: He is well-developed. He is not diaphoretic.   Eyes:      General: No scleral icterus.     Conjunctiva/sclera: Conjunctivae normal.   Cardiovascular:      Rate and Rhythm: Normal rate.   Pulmonary:      Effort: Pulmonary effort is normal. No respiratory distress.      Breath sounds: No stridor.   Abdominal:      General: There is no distension.      Palpations: Abdomen is soft.      Tenderness: There is no abdominal tenderness. There is no guarding.   Musculoskeletal:      Cervical back: Neck supple.      Comments: Moves extremities.    Skin:     General: Skin is warm and dry.      Findings: No rash.   Neurological:      Mental Status: He is alert and oriented to person, place, and time.   Psychiatric:         Behavior: Behavior normal.            DIAGNOSTIC RESULTS     LABS:  Labs Reviewed   BASIC METABOLIC PANEL - Abnormal; Notable for the following components:       Result Value    CREATININE 1.26 (*)     Potassium 3.5 (*)     All other components within normal limits   CBC WITH AUTO DIFFERENTIAL - Abnormal; Notable for the following components:    MCV 81.8 (*)     Eosinophils % 0 (*)     All other components within normal limits   COVID-19       All other labs were within normal range or not returned as of this dictation.    EMERGENCY DEPARTMENT COURSE and DIFFERENTIAL DIAGNOSIS/MDM:   Vitals:    Vitals:    07/11/20 1343   BP: 125/89   Pulse: 94   Resp: 20   Temp: 97.9 ??F (36.6 ??C)   TempSrc: Oral   SpO2: 98%   Weight: 226 lb 6.4 oz (102.7 kg)   Height: 5\' 9"  (1.753 m)       MEDICATIONS GIVEN IN THE ED:  Medications   0.9 % sodium chloride bolus (1,000 mLs IntraVENous New Bag 07/11/20 1515)       CLINICAL DECISION MAKING:  The patient presented alert with a nontoxic appearance and was seen in conjunction with Dr. . Covid-19 test was pending. Isolation and return precautions were provided. Prescriptions were written for Zofran, tessalon perles, and motrin. Follow up with pcp for a recheck, further evaluation and treatment. Evaluation and treatment course in the ED, and plan of care upon discharge was discussed in length with the patient.  Patient had no further questions prior to being discharged and was instructed to return to the ED for new or worsening symptoms. Care was provided during an unprecedented national emergency due to the novel coronavirus, Covid-19.     The patient's signs and symptoms are consistent with an acute mild viral illness.  At this time there is significant evidence of Covid-19 community spread due to this pandemic, and I feel the patient most likely has mild Covid-19 or other  viral illness.      The patient is nontoxic and well appearing, no evidence of hypoxia or impending respiratory failure.  The patient is  tolerating PO. I do not feel the patient has evidence of significant dehydration or end organ failure at this time.   I do not feel the patient has an emergent medical condition at this time.      Direct patient contact is avoided at this time due to the critically low supplies of PPE worldwide and an effort to steward appropriate use of PPE.  I performed a medical screening exam that is compliant with the Declaration of Public Health Emergency in the [place], secondary to the Pandemic Infectious Disease of SARS-Coronavirus-2.     We are not testing for influenza or other viral etiologies at this time due to the significant evidence of virus coinfections during this pandemic.    The patient is referred to appropriate outpatient follow up through their PCP or [facility].  I discussed with the patient home isolation measures, anticipatory guidance, discharge instructions, and to return immediately for worsening of symptoms.  The patient is agreeable with this plan.      FINAL IMPRESSION      1. Viral illness    2. COVID-19 virus test result unknown            Problem List  Patient Active Problem List   Diagnosis Code   ??? Hidradenitis L73.2   ??? Type 2 diabetes mellitus without complication, without long-term current use of insulin (HCC) E11.9         DISPOSITION/PLAN   DISPOSITION Decision To Discharge 07/11/2020 04:24:41 PM      PATIENT REFERRED TO:   Riesa Pope, MD  [address]  [place] [address]  [phone]    Schedule an appointment as soon as possible for a visit       [facility] ED  [address] [place] [address]  [phone]    If symptoms worsen, As needed      DISCHARGE MEDICATIONS:     New Prescriptions    BENZONATATE (TESSALON) 100 MG CAPSULE    Take 1-2 capsules by mouth 3 times daily as needed for Cough    IBUPROFEN (ADVIL;MOTRIN) 800 MG TABLET    Take 1 tablet by mouth every 8 hours as needed for Pain or Fever    ONDANSETRON (ZOFRAN ODT) 4 MG DISINTEGRATING  TABLET    Take 1 tablet by mouth every 8 hours as needed for Nausea or Vomiting           (Please note that portions of this note were completed with a voice recognition program.  Efforts were made to edit the dictations but occasionally words are mis-transcribed.)    Carolyn Stare, APRN - CNP      Carolyn Stare, APRN - CNP  07/11/20 1824

## 2020-07-11 NOTE — Discharge Instructions (Signed)
FOLLOW THE ISOLATION GUIDELINES WHILE WAITING ON YOUR TEST RESULTS. IF YOUR RESULTS ARE POSITIVE FOLLOW THE ISOLATION GUIDELINES FOR 10 DAYS. IF YOU HAVE SYMPTOMS AFTER 10 DAYS CONTINUE TO ISOLATE UNTIL YOU HAVE BEEN SYMPTOM-FREE FOR 3 DAYS. RETURN TO THE ER IF YOUR CONDITION WORSENS. TAKE TYLENOL AS DIRECTED FOR YOUR FEVER.

## 2020-07-11 NOTE — ED Provider Notes (Signed)
eMERGENCY dEPARTMENT eNCOUnter   Independent Attestation     Pt Name: Cole Kent  MRN: [id number]  Birthdate [birth year]  Date of evaluation: 07/11/20     Clete Kuch is a 34 y.o. male with CC: Emesis and Concern For COVID-19 (symptoms since last thursday)      Based on the medical record the care appears appropriate.  I was personally available for consultation in the Emergency Department.  The care is provided during an unprecedented national emergency due to the novel coronavirus, COVID 19.    Karilyn Cota, DO  Attending Emergency Physician                    Steele Sizer, DO  07/11/20 1605

## 2020-07-12 LAB — COVID-19: SARS-CoV-2: DETECTED — AB

## 2020-07-12 NOTE — Care Coordination-Inpatient (Signed)
Patient contacted regarding COVID-19 risk. Discussed COVID-19 related testing which was pending at this time. Test results were pending. Patient informed of results, if available? Yes.      Ambulatory Care Manager contacted the patient by telephone to perform post discharge assessment. Call within 2 business days of discharge: Yes. Verified name and DOB with patient as identifiers. Provided introduction to self, and explanation of the CTN/ACM role, and reason for call due to risk factors for infection and/or exposure to COVID-19.     Symptoms reviewed with patient who verbalized the following symptoms: no worsening symptoms.      Due to no new or worsening symptoms encounter was not routed to provider for escalation. Discussed follow-up appointments. If no appointment was previously scheduled, appointment scheduling offered: Yes.  [facility] follow up appointment(s):   Future Appointments   Date Time Provider Department Center   08/31/2020 11:15 AM Lenon Oms, MD [facility]     Non-[facility] follow up appointment(s): will call     Non-face-to-face services provided:  Reviewed and followed up on pending diagnostic tests and treatments-covid     Advance Care Planning:   Does patient have an Advance Directive: not done    Educated patient about risk for severe COVID-19 due to risk factors according to CDC guidelines. ACM reviewed discharge instructions, medical action plan and red flag symptoms with the patient who verbalized understanding. Discussed COVID vaccination status: Yes. Education provided on COVID-19 vaccination as appropriate. Discussed exposure protocols and quarantine with CDC Guidelines. Patient was given an opportunity to verbalize any questions and concerns and agrees to contact ACM or health care provider for questions related to their healthcare.    Reviewed and educated patient on any new and changed medications related to discharge diagnosis     Was patient discharged with a pulse oximeter? No  Discussed and confirmed pulse oximeter discharge instructions and when to notify provider or seek emergency care.       ACM provided contact information. Plan for follow-up call in 5-7 days based on severity of symptoms and risk factors. Spoke with pt who said he feels about the same. He will check his mychart for his covid test results.

## 2020-07-13 NOTE — Telephone Encounter (Signed)
-----   Message from Lenoria Chime sent at 07/13/2020  1:27 PM EST -----  Subject: Message to Provider    QUESTIONS  Information for Provider? Patient tested positive for covid-19. He would   like a note for work excusing him.   ---------------------------------------------------------------------------  --------------  Cole Kent INFO  What is the best way for the office to contact you? OK to leave message on   voicemail  Preferred Call Back Phone Number? [phone]  ---------------------------------------------------------------------------  --------------  SCRIPT ANSWERS  Relationship to Patient? Self

## 2020-07-18 NOTE — Telephone Encounter (Signed)
-----   Message from [facility] sent at 07/18/2020  3:40 PM EST -----  Subject: Message to Provider    QUESTIONS  Information for Provider? patient tested positive for Covid in the ER over   12 days now. he is still very sick not eating lost over 15 pounds at   least. Still bad diarrhea and body aches. Is there anything he can do?  ---------------------------------------------------------------------------  --------------  CALL BACK INFO  What is the best way for the office to contact you? OK to leave message on   voicemail  Preferred Call Back Phone Number? [phone]  ---------------------------------------------------------------------------  --------------  SCRIPT ANSWERS  Relationship to Patient? [employer] Name? girlfriend / [place]  Do you have pain that has started or worsened within the past 24 hours? No  Are you vomiting blood or have bloody or black stool? No  Have you recently (14 days) seen a provider for this pain? Yes

## 2020-07-19 NOTE — Care Coordination-Inpatient (Signed)
Patient contacted regarding COVID-19 diagnosis. Discussed COVID-19 related testing which was available at this time. Test results were positive. Patient informed of results, if available? Yes    Ambulatory Care Manager contacted the patient by telephone to perform follow-up assessment. Verified name and DOB with patient as identifiers. Patient has following risk factors of: no known risk factors.      Symptoms reviewed with patient who verbalized the following symptoms: no worsening symptoms.   Due to no new or worsening symptoms encounter was not routed to provider for escalation.       Educated patient about risk for severe COVID-19 due to risk factors according to CDC guidelines. ACM reviewed discharge instructions, medical action plan and red flag symptoms with the patient who verbalized understanding. Discussed COVID vaccination status: Yes. Education provided on COVID-19 vaccination as appropriate. Discussed exposure protocols and quarantine with CDC Guidelines. Patient was given an opportunity to verbalize any questions and concerns and agrees to contact ACM or health care provider for questions related to their healthcare.    Was patient discharged with a pulse oximeter? No Discussed and confirmed pulse oximeter discharge instructions and when to notify provider or seek emergency care.    ACM provided contact information. Plan for follow-up call in 5-7 days based on severity of symptoms and risk factors.  Spoke with pt who said he is still feeling bad.  He has diarrhea cough ba and chills. He is trying to keep up with his fluids he will have someone bring him Pedialyte and try taking this. He did call his doctors office and tell them he is not feeling better yet. This is day 12 of symptoms for him. He will get the covid vaccine once he is recovered.

## 2020-07-27 NOTE — Care Coordination-Inpatient (Signed)
Attempted to reach pt for 14 day follow up left vm to call [phone]

## 2020-08-01 NOTE — Telephone Encounter (Signed)
Patient called stating he is still not felling well after testing positive for covid and needs a letter for work, please advise.

## 2020-08-17 ENCOUNTER — Encounter

## 2020-08-17 MED ORDER — CLINDAMYCIN PHOSPHATE 1 % EX LOTN
1 % | CUTANEOUS | 3 refills | Status: DC
Start: 2020-08-17 — End: 2021-05-02

## 2020-08-17 NOTE — Telephone Encounter (Signed)
 Last visit: 05/06/20  Last Med refill: 07/08/20  Does patient have enough medication for 72 hours: Yes    Next Visit Date:  Future Appointments   Date Time Provider Department Center   08/31/2020 11:15 AM Connye Delaine, MD [id number]       Health Maintenance   Topic Date Due   . Varicella vaccine (1 of 2 - 2-dose childhood series) Never done   . COVID-19 Vaccine (1) Never done   . Pneumococcal 0-64 years Vaccine (1 of 2 - PPSV23) Never done   . Diabetic foot exam  Never done   . Diabetic microalbuminuria test  Never done   . Diabetic retinal exam  Never done   . Hepatitis B vaccine (1 of 3 - Risk 3-dose series) Never done   . DTaP/Tdap/Td vaccine (1 - Tdap) Never done   . Flu vaccine (1) Never done   . A1C test (Diabetic or Prediabetic)  04/08/2021   . Lipid screen  04/08/2021   . Depression Screen  05/06/2021   . Hepatitis C screen  Completed   . HIV screen  Completed   . Hepatitis A vaccine  Aged Out   . Hib vaccine  Aged Out   . Meningococcal (ACWY) vaccine  Aged Out       Hemoglobin A1C (%)   Date Value   04/08/2020 5.9   06/15/2019 7.1 (H)             ( goal A1C is < 7)   No results found for: LABMICR  LDL Cholesterol (mg/dL)   Date Value   16/05/9603 51       (goal LDL is <100)   BUN (mg/dL)   Date Value   54/04/8118 12     BP Readings from Last 3 Encounters:   07/11/20 125/89   05/06/20 (!) 140/95   04/08/20 (!) 140/87          (goal 120/80)    All Future Testing planned in [facility]              Patient Active Problem List:     Hidradenitis     Type 2 diabetes mellitus without complication, without long-term current use of insulin (HCC)

## 2020-08-31 ENCOUNTER — Ambulatory Visit
Admit: 2020-08-31 | Discharge: 2020-08-31 | Payer: PRIVATE HEALTH INSURANCE | Attending: Dermatology | Primary: Student in an Organized Health Care Education/Training Program

## 2020-08-31 DIAGNOSIS — L732 Hidradenitis suppurativa: Secondary | ICD-10-CM

## 2020-08-31 MED ORDER — BENZOYL PEROXIDE 5 % EX LIQD
5 % | CUTANEOUS | 11 refills | Status: DC
Start: 2020-08-31 — End: 2022-01-30

## 2020-08-31 MED ORDER — CLINDAMYCIN PHOSPHATE 1 % EX GEL
1 | CUTANEOUS | 11 refills | 30.00000 days | Status: DC
Start: 2020-08-31 — End: 2020-09-01

## 2020-08-31 MED ORDER — DOXYCYCLINE HYCLATE 100 MG PO CAPS
100 MG | ORAL_CAPSULE | ORAL | 2 refills | Status: AC
Start: 2020-08-31 — End: ?

## 2020-08-31 NOTE — Patient Instructions (Addendum)
Do not lay down for at least 1 hour after taking doxycycline, as it can cause a pill esophagitis. Avoid excessive dairy products for at least 2 hours after taking doxycycline as it will cause the medication to become inactive. You can not get pregnant while on this medication as it can cause birth defects. This medication can make your skin sensitive to the sun so be sure to use sunscreen. If you develop a persistent headache or vision changes, stop the medication and call the office.    Continue benzoyl peroxide wash and clindamycin lotion daily.  Use topicals consistently.   Follow up with Riki Rusk, PA in 4 months

## 2020-08-31 NOTE — Progress Notes (Signed)
Dermatology Patient Note  [facility], [facility]  [address]  [address]  [place] [address]  Dept: [phone]  Dept Fax: [phone]      VISITDATE: 08/31/2020   REFERRING PROVIDER: No ref. provider found      Cole Kent is a 35 y.o. male  who presents today in the office for:    Other (HS-pt needs Clindamycin. He states that when he flares it stays for one month. He has active boils in his groin. )      HISTORY OF PRESENT ILLNESS:  As above. Patient states that the clindamycin lotion was helping but he ran out. He has been having a flare around his groin that is very painful.     MEDICAL PROBLEMS:  Patient Active Problem List    Diagnosis Date Noted   ??? Hidradenitis 04/08/2020   ??? Type 2 diabetes mellitus without complication, without long-term current use of insulin (HCC) 04/08/2020       CURRENT MEDICATIONS:   Current Outpatient Medications   Medication Sig Dispense Refill   ??? Acetaminophen-Codeine (TYLENOL WITH CODEINE #3 PO)      ??? clindamycin (CLEOCIN T) 1 % lotion Apply to affected areas daily 60 mL 3   ??? ondansetron (ZOFRAN ODT) 4 MG disintegrating tablet Take 1 tablet by mouth every 8 hours as needed for Nausea or Vomiting 20 tablet 0   ??? ibuprofen (ADVIL;MOTRIN) 800 MG tablet Take 1 tablet by mouth every 8 hours as needed for Pain or Fever 15 tablet 0   ??? Emollient (CERAVE) CREA Apply twice daily as moisturizer to full skin 453 g 2   ??? acetaminophen (TYLENOL) 500 MG tablet Take 2 tablets by mouth 3 times daily as needed for Pain 180 tablet 0   ??? benzoyl peroxide 5 % external liquid Wash affected areas once daily 227 g 3   ??? emollient (BIAFINE) cream Apply topically as needed. 1 each 3   ??? metFORMIN (GLUCOPHAGE) 500 MG tablet Take 1 tablet by mouth 2 times daily (with meals) 180 tablet 1   ??? Skin Protectants, Misc. (EUCERIN) cream Apply topically as needed to flexural areas 2 times per day 1 Package 1     No current  facility-administered medications for this visit.       ALLERGIES:   No Known Allergies    SOCIAL HISTORY:  Social History     Tobacco Use   ??? Smoking status: Never Smoker   ??? Smokeless tobacco: Never Used   Substance Use Topics   ??? Alcohol use: Never       Pertinent ROS:  Review of Systems  Skin: Denies any new changing, growing or bleeding lesions or rashes except as described in the HPI   Constitutional: Denies fevers, chills, and malaise.    PHYSICAL EXAM:   BP 134/83 (Site: Left Upper Arm, Position: Sitting, Cuff Size: Large Adult)    Pulse 91    Temp 96.9 ??F (36.1 ??C) (Temporal)    Ht 5\' 9"  (1.753 m)    Wt 234 lb 9.6 oz (106.4 kg)    SpO2 96%    BMI 34.64 kg/m??     The patient is generally well appearing, well nourished, alert and conversational. Affect is normal.    Cutaneous Exam:  Physical Exam  Focused exam of groin was performed    Facial covering was not removed during examination.    Diagnoses/exam findings/medical history pertinent to this visit  are listed below:    Assessment:   Diagnosis Orders   1. Hidradenitis suppurativa          Plan:  Hidradenitis suppurativa, Hurley I-II, groin  - chronic illness with progression and/or exacerbation   - continue bpo wash and clindamycin lotion daily  - try to use topicals consistently   - doxycycline 100 mg BID x3 months   Patient counseled regarding side effects of doxycycline, including photosensitivity, gastrointestinal upset, chemical esophagitis, teratogenicity and severe drug reaction including pseudotumor cerebri. Patient also counseled to take doxycycline with a full glass of water.     RTC 4 months with Riki Rusk     No future appointments.      Patient Instructions   Do not lay down for at least 1 hour after taking doxycycline, as it can cause a pill esophagitis. Avoid excessive dairy products for at least 2 hours after taking doxycycline as it will cause the medication to become inactive. You can not get pregnant while on this medication as it can cause  birth defects. This medication can make your skin sensitive to the sun so be sure to use sunscreen. If you develop a persistent headache or vision changes, stop the medication and call the office.    Continue benzoyl peroxide wash and clindamycin lotion daily.  Use topicals consistently.       This note was created with the assistance of a speech-recognition program.  Although the intention is to generate a document that actually reflects the content of the visit, no guarantees can be provided that every mistake has been identified and corrected by editing.    I, Dr. Dulcy Fanny, personally performed the services described in this documentation, as scribed by [facility] in my presence, and it is both accurate and complete.     Electronically signed by Lenon Oms, MD on 08/31/20 at 12:03 PM EST

## 2020-09-01 ENCOUNTER — Encounter

## 2020-09-01 MED ORDER — CLINDAMYCIN PHOSPHATE 1 % EX GEL
1 % | CUTANEOUS | 11 refills | Status: AC
Start: 2020-09-01 — End: 2020-09-08

## 2020-09-01 MED ORDER — CERAVE EX CREA
CUTANEOUS | 2 refills | Status: DC
Start: 2020-09-01 — End: 2020-10-26

## 2020-09-01 NOTE — Telephone Encounter (Signed)
You just prescribed clinda so, I think this is just for the cerave?

## 2020-09-13 ENCOUNTER — Ambulatory Visit
Admit: 2020-09-13 | Discharge: 2020-09-13 | Payer: PRIVATE HEALTH INSURANCE | Attending: Student in an Organized Health Care Education/Training Program | Primary: Student in an Organized Health Care Education/Training Program

## 2020-09-13 DIAGNOSIS — I10 Essential (primary) hypertension: Secondary | ICD-10-CM

## 2020-09-13 MED ORDER — METFORMIN HCL 500 MG PO TABS
500 MG | ORAL_TABLET | Freq: Two times a day (BID) | ORAL | 1 refills | Status: DC
Start: 2020-09-13 — End: 2020-10-11

## 2020-09-13 MED ORDER — HYDROCHLOROTHIAZIDE 25 MG PO TABS
25 MG | ORAL_TABLET | Freq: Every day | ORAL | 0 refills | Status: DC
Start: 2020-09-13 — End: 2021-05-02

## 2020-09-13 NOTE — Progress Notes (Signed)
Cole Kent (DOB:  09-19-1985) is a 35 y.o. male,Established patient, here for evaluation of the following chief complaint(s):  Follow-up (medical clearance)    ASSESSMENT/PLAN:  1. Essential hypertension  -     hydroCHLOROthiazide (HYDRODIURIL) 25 MG tablet; Take 0.5 tablets by mouth daily, Disp-30 tablet, R-0Normal  -     DME Order for (Specify) as OP  - Provided blood pressure log sheet to start recording blood pressures at home. Instructed on when to use. Patient verbalized understanding  2. Type 2 diabetes mellitus without complication, without long-term current use of insulin (HCC)  -     metFORMIN (GLUCOPHAGE) 500 MG tablet; Take 1 tablet by mouth 2 times daily (with meals), Disp-180 tablet, R-1Normal    Return in about 1 month (around 10/11/2020) for Blood pressure f/u. DM f/u.       Subjective      SUBJECTIVE/OBJECTIVE:    HPI    Mr. Cole Kent, 35 year old male, previous medical history of Type II DM on metformin, and hidradenitis. Presenting to the clinic today for clearance for dental visit.    Newly diagnosed HTN  Patient was found to have elevated blood pressure at two separate appointments at the dentist's office. The first one was 142/114 on 09/06/2020, and second time was 152/>100 on 09/09/2020. Patient could not have dental cleaning and non-emergent procedures done due to elevated blood pressure.    No previous history of HTN    Blood pressure today 137/86 in the office    Hidradenitis  Symptoms improving. Patient not concerned about it anymore  Taking medications as prescribed  Following with dermatology, Dr. Dulcy Fanny.    COVID  Diagnosed 07/2020  Recent rapids have come back negative  Slightly short of breath, however, improving  Improving taste and smell    DM Type II  Not taking metformin daily  Last HbA1c 04/2020 was 5.9  Complaining of some polyuria    Review of Systems   Constitutional: Negative for activity change and fever.   HENT: Negative for congestion and sore throat.     Respiratory: Negative for cough and shortness of breath.    Cardiovascular: Negative for chest pain.   Gastrointestinal: Negative for abdominal pain, constipation, diarrhea, nausea and vomiting.   Endocrine: Positive for polyuria.   Genitourinary: Negative for difficulty urinating.   Psychiatric/Behavioral: Negative for agitation, behavioral problems and confusion.     Objective   Physical Exam  Vitals and nursing note reviewed.   HENT:      Mouth/Throat:      Mouth: Mucous membranes are moist.      Dentition: Dental caries (Upper bilateral) present. No dental abscesses.   Cardiovascular:      Rate and Rhythm: Normal rate and regular rhythm.      Heart sounds: No murmur heard.      Pulmonary:      Effort: No respiratory distress.      Breath sounds: Normal breath sounds. No wheezing.   Abdominal:      Palpations: Abdomen is soft.      Tenderness: There is no abdominal tenderness. There is no guarding.   Psychiatric:         Behavior: Behavior normal.       On this date 09/13/2020 I have spent 30 minutes reviewing previous notes, test results and face to face with the patient discussing the diagnosis and importance of compliance with the treatment plan as well as documenting on the day of the visit.  An electronic signature was used to authenticate this note.    --Jasper Loser, MD

## 2020-09-13 NOTE — Progress Notes (Signed)
I have reviewed and discussed key elements of Cole Kent   History,exam with the resident including plan of care and follow up and agree with the care anda plan.    Needed to follow up on elevated BP before dental treatment.    No previous diag of HTN.    Vitals:    09/13/20 0841   BP: 137/86   Pulse: 82       Start HCTZ     Close follow up.

## 2020-09-13 NOTE — Progress Notes (Signed)
Visit Information    Have you changed or started any medications since your last visit including any over-the-counter medicines, vitamins, or herbal medicines? no   Are you having any side effects from any of your medications? -  no  Have you stopped taking any of your medications? Is so, why? -  no    Have you seen any other physician or provider since your last visit? No  Have you had any other diagnostic tests since your last visit? No  Have you been seen in the emergency room and/or had an admission to a hospital since we last saw you? No  Have you had your routine dental cleaning in the past 6 months? no    Have you activated your MyChart account? If not, what are your barriers? Yes     Patient Care Team:  Adelene Amas, MD as PCP - General (Family Medicine)    Medical History Review  Past Medical, Family, and Social History reviewed and does not contribute to the patient presenting condition    Health Maintenance   Topic Date Due   ??? Varicella vaccine (1 of 2 - 2-dose childhood series) Never done   ??? COVID-19 Vaccine (1) Never done   ??? Pneumococcal 0-64 years Vaccine (1 of 2 - PPSV23) Never done   ??? Diabetic foot exam  Never done   ??? Diabetic microalbuminuria test  Never done   ??? Diabetic retinal exam  Never done   ??? Hepatitis B vaccine (1 of 3 - Risk 3-dose series) Never done   ??? DTaP/Tdap/Td vaccine (1 - Tdap) Never done   ??? Flu vaccine (1) Never done   ??? A1C test (Diabetic or Prediabetic)  04/08/2021   ??? Lipid screen  04/08/2021   ??? Depression Screen  05/06/2021   ??? Hepatitis C screen  Completed   ??? HIV screen  Completed   ??? Hepatitis A vaccine  Aged Out   ??? Hib vaccine  Aged Out   ??? Meningococcal (ACWY) vaccine  Aged Out

## 2020-09-13 NOTE — Patient Instructions (Signed)
Thank you for letting us take care of you today. We hope all your questions were addressed. If a question was overlooked or something else comes to mind after you return home, please contact a member of your Care Team listed below.      Your Care Team at [facility] is Team #3  Shevan Matto, MD (Faculty)  Hamid Riaz, MD (Faculty  Kingshuk Chowdhury, MD (Resident)  Hamza Bajwa,MD (Resident)   Ali Choudhry, MD (Resident)  Ahmad Javed, MD (Resident)  Brittany P., RMA  Jeanette W., RMA  Sharon S., Brenda M., Kristi S. (Front Office)  Patricia Ricard, RMA (Clinical Practice Manager)  Lisa McIntyre, RPH (Clinical Pharmacist)     Office phone number: [phone]    If you need to get in right away due to illness, please be advised we have "Same Day" appointments available Monday-Friday. Please call us at [phone] option #3 to schedule your "Same Day" appointment.

## 2020-10-11 ENCOUNTER — Ambulatory Visit
Admit: 2020-10-11 | Discharge: 2020-10-11 | Payer: PRIVATE HEALTH INSURANCE | Attending: Student in an Organized Health Care Education/Training Program | Primary: Student in an Organized Health Care Education/Training Program

## 2020-10-11 DIAGNOSIS — E119 Type 2 diabetes mellitus without complications: Secondary | ICD-10-CM

## 2020-10-11 LAB — POCT GLYCOSYLATED HEMOGLOBIN (HGB A1C): Hemoglobin A1C: 5.6 %

## 2020-10-11 MED ORDER — METFORMIN HCL 500 MG PO TABS
500 MG | ORAL_TABLET | Freq: Every day | ORAL | 1 refills | Status: DC
Start: 2020-10-11 — End: 2021-05-02

## 2020-10-11 NOTE — Progress Notes (Signed)
Visit Information    Have you changed or started any medications since your last visit including any over-the-counter medicines, vitamins, or herbal medicines? no   Have you stopped taking any of your medications? Is so, why? -  Yes see list   Are you having any side effects from any of your medications? - no    Have you seen any other physician or provider since your last visit?  no   Have you had any other diagnostic tests since your last visit?  no   Have you been seen in the emergency room and/or had an admission in a hospital since we last saw you?  no   Have you had your routine dental cleaning in the past 6 months?  no     Do you have an active MyChart account? If no, what is the barrier?  Yes    Patient Care Team:  Adelene Amas, MD as PCP - General (Family Medicine)    Medical History Review  Past Medical, Family, and Social History reviewed and does not contribute to the patient presenting condition    Health Maintenance   Topic Date Due   ??? Varicella vaccine (1 of 2 - 2-dose childhood series) Never done   ??? COVID-19 Vaccine (1) Never done   ??? Pneumococcal 0-64 years Vaccine (1 of 2 - PPSV23) Never done   ??? Diabetic foot exam  Never done   ??? Diabetic microalbuminuria test  Never done   ??? Diabetic retinal exam  Never done   ??? Hepatitis B vaccine (1 of 3 - Risk 3-dose series) Never done   ??? DTaP/Tdap/Td vaccine (1 - Tdap) Never done   ??? Flu vaccine (1) Never done   ??? A1C test (Diabetic or Prediabetic)  04/08/2021   ??? Lipid screen  04/08/2021   ??? Depression Screen  05/06/2021   ??? Potassium monitoring  07/11/2021   ??? Creatinine monitoring  07/11/2021   ??? Hepatitis C screen  Completed   ??? HIV screen  Completed   ??? Hepatitis A vaccine  Aged Out   ??? Hib vaccine  Aged Out   ??? Meningococcal (ACWY) vaccine  Aged Out

## 2020-10-11 NOTE — Progress Notes (Signed)
Cole Kent (DOB:  21-Nov-1985) is a 35 y.o. male,Established patient, here for evaluation of the following chief complaint(s):  1 Month Follow-Up (BP ), Health Maintenance (no DM eye exam ), and Discuss Medications (60 mg for clindamycin cream )    ASSESSMENT/PLAN:    1. Type 2 diabetes mellitus without complication, without long-term current use of insulin (HCC)  -     HM DIABETES FOOT EXAM  -     Microalbumin, Ur; Future  -     POCT glycosylated hemoglobin (Hb A1C)  -     metFORMIN (GLUCOPHAGE) 500 MG tablet; Take 1 tablet by mouth daily (with breakfast), Disp-180 tablet, R-1Adjust Sig  - Decreased metformin dose from 500 mg BID to once daily  2. Need for prophylactic vaccination against Streptococcus pneumoniae (pneumococcus)  -     Pneumococcal polysaccharide vaccine 23-valent PPSV23  3. Need for prophylactic vaccination against diphtheria-tetanus-pertussis (DTP)  -     Tdap (age 75y and older) IM (Boostrix)  4. Healthcare maintenance  -     INFLUENZA, QUADV, 3 YRS AND OLDER, IM PF, PREFILL SYR OR SDV, 0.5ML (AFLURIA QUADV, PF)  -     Hep B Vaccine Adult (ENGERIX-B)  5. Essential hypertension   - BP at today's visit 127/79   - Patient advised to continue same treatment with HCTZ 12.5 mg oral daily    Return in about 3 months (around 01/11/2021), or if symptoms worsen or fail to improve, for DM.Cole Kent       Subjective      SUBJECTIVE/OBJECTIVE:    HPI    Mr. Cole Kent, previous history of HTN, and Type II DM.    DM Type II  Compliant with metformin 500 mg BID  HbA1c today 5.6  Previous A1c in Nov 2021 was 5.9  No polyuria, nocturia.  Appetite good  No concerns    Diabetic foot exam:     Left Foot:   Visual Exam: normal   Pulse DP: 2+ (normal)   Filament test: normal sensation   Vibratory sensation: normal      Right Foot:   Visual Exam: normal   Pulse DP: 2+ (normal)   Filament test: normal sensation   Vibratory sensation: normal    HTN  BP today 127/79  Compliant with HCTZ 12.5 mg oral  daily  Asymptomatic    Patient asking if we can get him a bigger tube of clindamycin lotion 1 % that he uses for hidradenitis. Satisfied with medication. Discussed with patient that we are already giving him 60 mg tube, and that there are not bigger sizes in the system. Patient understood. Refills in place. Does not need another refill.    No other concerns.    Review of Systems   Constitutional: Negative for activity change and fever.   HENT: Negative for congestion and sore throat.    Respiratory: Negative for cough and shortness of breath.    Cardiovascular: Negative for chest pain and leg swelling.   Gastrointestinal: Negative for abdominal pain, nausea and vomiting.   Genitourinary: Negative for difficulty urinating and dysuria.   Neurological: Negative for syncope and headaches.   Psychiatric/Behavioral: Negative for agitation, behavioral problems and confusion.     Objective   Physical Exam  Vitals and nursing note reviewed.   Constitutional:       General: He is not in acute distress.     Appearance: Normal appearance. He is not ill-appearing or toxic-appearing.   Cardiovascular:  Rate and Rhythm: Normal rate and regular rhythm.      Heart sounds: Normal heart sounds. No murmur heard.      Pulmonary:      Effort: No respiratory distress.      Breath sounds: Normal breath sounds. No wheezing.   Abdominal:      Palpations: Abdomen is soft.      Tenderness: There is no abdominal tenderness. There is no guarding or rebound.   Musculoskeletal:         General: No tenderness.      Right lower leg: No edema.      Left lower leg: No edema.   Neurological:      General: No focal deficit present.      Mental Status: He is alert and oriented to person, place, and time.   Psychiatric:         Mood and Affect: Mood normal.         Behavior: Behavior normal.       On this date 10/11/2020 I have spent 30 minutes reviewing previous notes, test results and face to face with the patient discussing the diagnosis and  importance of compliance with the treatment plan as well as documenting on the day of the visit.    An electronic signature was used to authenticate this note.    --Nyoka Cowden, MD

## 2020-10-11 NOTE — Progress Notes (Signed)
Vaccine Information Sheet, "Influenza - Inactivated"  given to Cole Kent, or parent/legal guardian of  Cole Kent and verbalized understanding.    Patient responses:    Have you ever had a reaction to a flu vaccine? No  Do you have any current illness?  No  Have you ever had Guillian Barre Syndrome?  No  Do you have a serious allergy to any of the following: Neomycin, Polymyxin, Thimerosal, eggs or egg products? No    Flu vaccine given per order. Please see immunization tab.    Risks and benefits explained.  Current VIS given.

## 2020-10-11 NOTE — Patient Instructions (Addendum)
Thank you for letting us take care of you today. We hope all your questions were addressed. If a question was overlooked or something else comes to mind after you return home, please contact a member of your Care Team listed below.      Your Care Team at [facility] is Team #3  Ria Clock, MD (Faculty)  Benard Rink, MD (Faculty  Geralynn Ochs, MD (Resident)  Alvester Chou (Resident)   Beatris Ship, MD (Resident)  Nyoka Cowden, MD (Resident)  Verdene Lennert., RMA  Urbano Heir., RMA  Silas Flood., Marisa Cyphers., [facility] Office)  Terrance Mass, [place] (Clinical Practice Manager)  Katherine Mantle, [facility] (Clinical Pharmacist)     Office phone number: [phone]    If you need to get in right away due to illness, please be advised we have "Same Day" appointments available Monday-Friday. Please call us at [phone] option #3 to schedule your "Same Day" appointment.          Patient Education        Pneumococcal Polysaccharide Vaccine: What You Need to Know  Why get vaccinated?  Pneumococcal polysaccharide vaccine (PPSV23) can prevent pneumococcal disease.  Pneumococcal disease refers to any illness caused by pneumococcal bacteria. These bacteria can cause many types of illnesses, including pneumonia, which is an infection of the lungs. Pneumococcal bacteria are one of the most common causes of pneumonia.  Besides pneumonia, pneumococcal bacteria can also cause:  ?? Ear infections,  ?? Sinus infections  ?? Meningitis (infection of the tissue covering the brain and spinal cord)  ?? Bacteremia (bloodstream infection)  Anyone can get pneumococcal disease, but children under 39 years of age, people with certain medical conditions, adults 65 years or older, and cigarette smokers are at the highest risk.  Most pneumococcal infections are mild. However, some can result in long-term problems, such as brain damage or hearing loss. Meningitis, bacteremia, and pneumonia caused by pneumococcal disease can be  fatal.  PPSV23  PPSV23 protects against 23 types of bacteria that cause pneumococcal disease.  PPSV23 is recommended for:  ?? All adults 65 years or older,  ?? Anyone 2 years or older with certain medical conditions that can lead to an increased risk for pneumococcal disease.  Most people need only one dose of PPSV23. A second dose of PPSV23, and another type of pneumococcal vaccine called PCV13, are recommended for certain high-risk groups. Your health care provider can give you more information.  People 65 years or older should get a dose of PPSV23 even if they have already gotten one or more doses of the vaccine before they turned 65.  Talk with your health care provider  Tell your vaccine provider if the person getting the vaccine:  ?? Has had an allergic reaction after a previous dose of PPSV23, or has any severe, life-threatening allergies.  In some cases, your health care provider may decide to postpone PPSV23 vaccination to a future visit.  People with minor illnesses, such as a cold, may be vaccinated. People who are moderately or severely ill should usually wait until they recover before getting PPSV23.  Your health care provider can give you more information.  Risks of a vaccine reaction  ?? Redness or pain where the shot is given, feeling tired, fever, or muscle aches can happen after PPSV23.  People sometimes faint after medical procedures, including vaccination. Tell your provider if you feel dizzy or have vision changes or ringing in the ears.  As with any medicine,  there is a very remote chance of a vaccine causing a severe allergic reaction, other serious injury, or death.  What if there is a serious problem?  An allergic reaction could occur after the vaccinated person leaves the clinic. If you see signs of a severe allergic reaction (hives, swelling of the face and throat, difficulty breathing, a fast heartbeat, dizziness, or weakness), call 9-1-1 and get the person to the nearest hospital.  For other  signs that concern you, call your health care provider.  Adverse reactions should be reported to the Vaccine Adverse Event Reporting System (VAERS). Your health care provider will usually file this report, or you can do it yourself. Visit the VAERS website at www.vaers.LAgents.nohhs.gov at www.vaers.LAgents.nohhs.gov or call (639) 877-88121-4381780212. VAERS is only for reporting reactions, and VAERS staff do not give medical advice.   How can I learn more?  ?? Ask your health care provider.  ?? Call your local or state health department.  ?? Contact the Centers for Disease Control and Prevention (CDC):  ? Call 514-176-23371-(609)168-5390 (1-800-CDC-INFO) or  ? Visit CDC's website at PicCapture.uywww.cdc.gov/vaccines  Vaccine Information Statement  PPSV23 Vaccine  06/04/2018  Department of Health and Insurance risk surveyorHuman Services  Centers for Disease Control and Prevention  Many Vaccine Information Statements are available in Spanish and other languages. See PromoAge.com.brwww.immunize.org/vis.  Hojas de informaci??n Sobre Vacunas est??n disponibles en espa??ol y en muchos otros idiomas. Visite PurchaseFilters.athttp://www.immunize.org/vis.  Care instructions adapted under license by [facility]. If you have questions about a medical condition or this instruction, always ask your healthcare professional. Healthwise, Incorporated disclaims any warranty or liability for your use of this information.         Patient Education        Influenza (Flu) Vaccine (Inactivated or Recombinant): What You Need to Know  Why get vaccinated?  Influenza vaccine can prevent influenza (flu).  Flu is a contagious disease that spreads around the [place] every year, usually between October and May. Anyone can get the flu, but it is more dangerous for some people. Infants and young children, people 9065 years and older, pregnant people, and people with certain health conditions or a weakened immune system are at greatest risk of flu complications.  Pneumonia, bronchitis, sinus infections, and ear infections are examples of flu-related  complications. If you have a medical condition, such as heart disease, cancer, or diabetes, flu can make it worse.  Flu can cause fever and chills, sore throat, muscle aches, fatigue, cough, headache, and runny or stuffy nose. Some people may have vomiting and diarrhea, though this is more common in children than adults.  Each year, thousands of people in the [place] die from flu, and many more are hospitalized. Flu vaccine prevents millions of illnesses and flu-related visits to the doctor each year.  Influenza vaccines  CDC recommends everyone 6 months and older get vaccinated every flu season. Children 6 months through 808 years of age may need 2 doses during a single flu season. Everyone else needs only 1 dose each flu season.  It takes about 2 weeks for protection to develop after vaccination.  There are many flu viruses, and they are always changing. Each year a new flu vaccine is made to protect against the influenza viruses believed to be likely to cause disease in the upcoming flu season. Even when the vaccine doesn't exactly match these viruses, it may still provide some protection.  Influenza vaccine does not cause flu.  Influenza vaccine may be given at the  same time as other vaccines.  Talk with your health care provider  Tell your vaccination provider if the person getting the vaccine:  ?? Has had an allergic reaction after a previous dose of influenza vaccine, or has any severe, life-threatening allergies  ?? Has ever had Guillain-Barr?? Syndrome (also called "GBS")  In some cases, your health care provider may decide to postpone influenza vaccination until a future visit.  Influenza vaccine can be administered at any time during pregnancy. People who are or will be pregnant during influenza season should receive inactivated influenza vaccine.  People with minor illnesses, such as a cold, may be vaccinated. People who are moderately or severely ill should usually wait until they recover before  getting influenza vaccine.  Your health care provider can give you more information.  Risks of a vaccine reaction  ?? Soreness, redness, and swelling where the shot is given, fever, muscle aches, and headache can happen after influenza vaccination.  ?? There may be a very small increased risk of Guillain-Barr?? Syndrome (GBS) after inactivated influenza vaccine (the flu shot).  Young children who get the flu shot along with pneumococcal vaccine (PCV13) and/or DTaP vaccine at the same time might be slightly more likely to have a seizure caused by fever. Tell your health care provider if a child who is getting flu vaccine has ever had a seizure.  People sometimes faint after medical procedures, including vaccination. Tell your provider if you feel dizzy or have vision changes or ringing in the ears.  As with any medicine, there is a very remote chance of a vaccine causing a severe allergic reaction, other serious injury, or death.  What if there is a serious problem?  An allergic reaction could occur after the vaccinated person leaves the clinic. If you see signs of a severe allergic reaction (hives, swelling of the face and throat, difficulty breathing, a fast heartbeat, dizziness, or weakness), call 9-1-1 and get the person to the nearest hospital.  For other signs that concern you, call your health care provider.  Adverse reactions should be reported to the Vaccine Adverse Event Reporting System (VAERS). Your health care provider will usually file this report, or you can do it yourself. Visit the VAERS website at www.vaers.LAgents.no or call (626)358-8165. VAERS is only for reporting reactions, and VAERS staff members do not give medical advice.  The National Vaccine Injury Compensation Program  The National Vaccine Injury Compensation Program (VICP) is a federal program that was created to compensate people who may have been injured by certain vaccines. Claims regarding alleged injury or death due to vaccination have a  time limit for filing, which may be as short as two years. Visit the VICP website at SpiritualWord.at or call 254-691-4300 to learn about the program and about filing a claim.  How can I learn more?  ?? Ask your health care provider.  ?? Call your local or state health department.  ?? Visit the website of the Food and Drug Administration (FDA) for vaccine package inserts and additional information at FinderList.no.  ?? Contact the Centers for Disease Control and Prevention (CDC):  ? Call 956-189-0144 (1-800-CDC-INFO) or  ? Visit CDC's website at BiotechRoom.com.cy.  Vaccine Information Statement  Inactivated Influenza Vaccine  03/11/2020  42 [place] ?? [phone]  Department of Health and Insurance risk surveyor for Disease Control and Prevention  Many vaccine information statements are available in Spanish and other languages. See PromoAge.com.br.  Hojas de informaci??n sobre vacunas est??n disponibles en espa??ol  y en muchos otros idiomas. Visite PromoAge.com.br.  Care instructions adapted under license by [facility]. If you have questions about a medical condition or this instruction, always ask your healthcare professional. Healthwise, Incorporated disclaims any warranty or liability for your use of this information.         Patient Education        hepatitis B adult vaccine  Pronunciation:  HEP a TYE tis B a DULT VAX een  Brand:  Engerix-B, Heplisav-B, Recombivax HB Adult, Recombivax HB Dialysis Formulation  What is the most important information I should know about this vaccine?  You should not receive hepatitis B vaccine if you are allergic to yeast.  This vaccine will not protect against hepatitis B if you are already infected with the virus, even if you do not yet show symptoms.  What is hepatitis B vaccine?  Hepatitis B is a serious disease caused by a virus. Hepatitis causes inflammation of the liver, vomiting, and jaundice (yellowing of the skin or eyes).  Hepatitis can lead to liver cancer, cirrhosis, or death.  Hepatitis B is spread through blood or bodily fluids, sexual contact, and by sharing items such as a razor, toothbrush, or IV drug needle with an infected person. Hepatitis B can also be passed to a baby during childbirth when the mother is infected.  The hepatitis B adult vaccine is used to help prevent this disease in adults. The dialysis form of this vaccine is for adults receiving dialysis.  This vaccine helps your body develop immunity to hepatitis B, but it will not treat an active infection you already have.  Vaccination with hepatitis B adult vaccine is recommended for all adults who are at risk of getting hepatitis B. Risk factors include: living with someone infected with hepatitis B virus; having more than one sex partner; men who have sex with men; having sexual contact with infected people; having hepatitis C, chronic liver disease, kidney disease, diabetes, HIV or AIDS; being on dialysis; using intravenous (IV) drugs; living or working in a facility for developmentally disabled people; working in Horticulturist, commercial and being exposed to blood or body fluids; living or working in a correctional facility; being a victim of sexual abuse or assault; and traveling to areas where hepatitis B is common.  Like any vaccine, the hepatitis B vaccine may not provide protection from disease in every person.  What should I discuss with my healthcare provider before receiving this vaccine?  Hepatitis B vaccine will not protect against infection with hepatitis A, C, and E, or other viruses that affect the liver. It may also not protect against hepatitis B if you are already infected with the virus, even if you do not yet show symptoms.  You should not receive this vaccine if you have ever had a life-threatening allergic reaction to any vaccine containing hepatitis B, or if you are allergic to yeast.  If you have any of these other conditions, your  vaccine may need to be postponed or not given at all:  ?? multiple sclerosis;  ?? kidney disease (or if you are on dialysis);  ?? a bleeding or blood clotting disorder such as hemophilia or easy bruising;  ?? weak immune system (caused by disease or by using certain medicine);  ?? an allergy to latex; or  ?? a neurologic disorder or disease affecting the brain (or if this was a reaction to a previous vaccine).  You can still receive a vaccine if you have a minor  cold. If you have a more severe illness with a fever or any type of infection, your doctor may recommend waiting until you get better before you receive this vaccine.  It is not known whether this vaccine will harm an unborn baby. However, if you are at a high risk for infection with hepatitis B during pregnancy, your doctor should determine whether you need this vaccine.  If you are pregnant, your name may be listed on a pregnancy registry to track the effects of this vaccine on the baby.  It may not be safe to breastfeed while receiving this medicine. Ask your doctor about any risk.  How is this vaccine given?  This vaccine is given as an injection (shot) into a muscle. A healthcare provider will give you this injection.  The hepatitis B vaccine is given in a series of 2 to 4 shots. The booster shots are sometimes given 1 month and 6 months after the first shot. If you have a high risk of hepatitis B infection, you may be given an additional booster 1 to 2 months after the third shot.  Your individual booster schedule may be different from these guidelines. Follow your doctor's instructions or the schedule recommended by the health department of the state you live in.  What happens if I miss a dose?  Contact your doctor if you will miss a booster dose or if you get behind schedule. The next dose should be given as soon as possible. There is no need to start over.  Be sure to receive all recommended doses of this vaccine or you may not be fully protected against  disease.  What happens if I overdose?  An overdose of this vaccine is unlikely to occur.  What should I avoid before or after receiving this vaccine?  Follow your doctor's instructions about any restrictions on food, beverages, or activity.  What are the possible side effects of this vaccine?  Get emergency medical help if you have signs of an allergic reaction (hives, difficult breathing, swelling in your face or throat) or a severe skin reaction (fever, sore throat, burning eyes, skin pain, red or purple skin rash with blistering and peeling).  You should not receive a booster vaccine if you had a life-threatening allergic reaction after the first shot.  Keep track of any and all side effects you have after receiving this vaccine. When you receive a booster dose, you will need to tell the doctor if the previous shot caused any side effects.  Becoming infected with hepatitis B is much more dangerous to your health than receiving this vaccine. However, like any medicine, this vaccine can cause side effects but the risk of serious side effects is extremely low.  Call your doctor at once if you have:  ?? a light-headed feeling, like you might pass out;  ?? seizure-like muscle movements; or  ?? fever, swollen glands.  Common side effects include:  ?? headache;  ?? feeling tired; or  ?? redness, pain, swelling, or a lump where the shot was given.  This is not a complete list of side effects and others may occur. Call your doctor for medical advice about side effects. You may report vaccine side effects to the [place] Department of Health and Human Services at [phone].  What other drugs will affect hepatitis B vaccine?  Other drugs may affect this vaccine, including prescription and over-the-counter medicines, vitamins, and herbal products. Tell your doctor about all your current medicines and any medicine you  start or stop using.  Where can I get more information?  Your doctor or pharmacist can provide more information  about this vaccine. Additional information is available from your local health department or the Centers for Disease Control and Prevention.  Remember, keep this and all other medicines out of the reach of children, never share your medicines with others, and use this medication only for the indication prescribed.   Every effort has been made to ensure that the information provided by [employer] ('[employer]') is accurate, up-to-date, and complete, but no guarantee is made to that effect. Drug information contained herein may be time sensitive. [employer] information has been compiled for use by healthcare practitioners and consumers in the [place] and therefore [employer] does not warrant that uses outside of the [place] are appropriate, unless specifically indicated otherwise. [employer]'s drug information does not endorse drugs, diagnose patients or recommend therapy. [employer]'s drug information is an Investment banker, corporate to assist licensed healthcare practitioners in caring for their patients and/or to serve consumers viewing this service as a supplement to, and not a substitute for, the expertise, skill, knowledge and judgment of healthcare practitioners. The absence of a warning for a given drug or drug combination in no way should be construed to indicate that the drug or drug combination is safe, effective or appropriate for any given patient. Mult[employer] does not assume any responsibility for any aspect of healthcare administered with the aid of information [employer] provides. The information contained herein is not intended to cover all possible uses, directions, precautions, warnings, drug interactions, allergic reactions, or adverse effects. If you have questions about the drugs you are taking, check with your doctor, nurse or pharmacist.  Copyright [phone] [employer] Version: 7.01. Revision date: 11/05/2018.  Care instructions adapted under license by [facility]. If you have questions  about a medical condition or this instruction, always ask your healthcare professional. Healthwise, Incorporated disclaims any warranty or liability for your use of this information.         Patient Education        Tdap (Tetanus, Diphtheria, Pertussis) Vaccine: What You Need to Know  Why get vaccinated?  Tdap vaccine can prevent tetanus, diphtheria, and pertussis.  Diphtheria and pertussis spread from person to person. Tetanus enters the body through cuts or wounds.  ?? TETANUS (T) causes painful stiffening of the muscles. Tetanus can lead to serious health problems, including being unable to open the mouth, having trouble swallowing and breathing, or death.  ?? DIPHTHERIA (D) can lead to difficulty breathing, heart failure, paralysis, or death.  ?? PERTUSSIS (aP), also known as "whooping cough," can cause uncontrollable, violent coughing that makes it hard to breathe, eat, or drink. Pertussis can be extremely serious especially in babies and young children, causing pneumonia, convulsions, brain damage, or death. In teens and adults, it can cause weight loss, loss of bladder control, passing out, and rib fractures from severe coughing.  Tdap vaccine  Tdap is only for children 7 years and older, adolescents, and adults.  Adolescents should receive a single dose of Tdap, preferably at age 17 or 12 years.  Pregnant people should get a dose of Tdap during every pregnancy, preferably during the early part of the third trimester, to help protect the newborn from pertussis. Infants are most at risk for severe, life-threatening complications from pertussis.  Adults who have never received Tdap should get a dose of Tdap.  Also, adults should receive a booster dose of either  Tdap or Td (a different vaccine that protects against tetanus and diphtheria but not pertussis) every 10 years, or after 5 years in the case of a severe or dirty wound or burn.  Tdap may be given at the same time as other vaccines.  Talk with your health  care provider  Tell your vaccination provider if the person getting the vaccine:  ?? Has had an allergic reaction after a previous dose of any vaccine that protects against tetanus, diphtheria, or pertussis, or has any severe, life-threatening allergies  ?? Has had a coma, decreased level of consciousness, or prolonged seizures within 7 days after a previous dose of any pertussis vaccine (DTP, DTaP, or Tdap)  ?? Has seizures or another nervous system problem  ?? Has ever had Guillain-Barr?? Syndrome (also called "GBS")  ?? Has had severe pain or swelling after a previous dose of any vaccine that protects against tetanus or diphtheria  In some cases, your health care provider may decide to postpone Tdap vaccination until a future visit.  People with minor illnesses, such as a cold, may be vaccinated. People who are moderately or severely ill should usually wait until they recover before getting Tdap vaccine.  Your health care provider can give you more information.  Risks of a vaccine reaction  ?? Pain, redness, or swelling where the shot was given, mild fever, headache, feeling tired, and nausea, vomiting, diarrhea, or stomachache sometimes happen after Tdap vaccination.  People sometimes faint after medical procedures, including vaccination. Tell your provider if you feel dizzy or have vision changes or ringing in the ears.  As with any medicine, there is a very remote chance of a vaccine causing a severe allergic reaction, other serious injury, or death.  What if there is a serious problem?  An allergic reaction could occur after the vaccinated person leaves the clinic. If you see signs of a severe allergic reaction (hives, swelling of the face and throat, difficulty breathing, a fast heartbeat, dizziness, or weakness), call 9-1-1 and get the person to the nearest hospital.  For other signs that concern you, call your health care provider.  Adverse reactions should be reported to the Vaccine Adverse Event Reporting  System (VAERS). Your health care provider will usually file this report, or you can do it yourself. Visit the VAERS website at www.vaers.LAgents.no or call 6152688774. VAERS is only for reporting reactions, and VAERS staff members do not give medical advice.  The National Vaccine Injury Compensation Program  The National Vaccine Injury Compensation Program (VICP) is a federal program that was created to compensate people who may have been injured by certain vaccines. Claims regarding alleged injury or death due to vaccination have a time limit for filing, which may be as short as two years. Visit the VICP website at SpiritualWord.at or call (719)045-1502 to learn about the program and about filing a claim.  How can I learn more?  ?? Ask your health care provider.  ?? Call your local or state health department.  ?? Visit the website of the Food and Drug Administration (FDA) for vaccine package inserts and additional information at FinderList.no.  ?? Contact the Centers for Disease Control and Prevention (CDC):  ? Call (508) 455-1669 (1-800-CDC-INFO) or  ? Visit CDC's website at PicCapture.uy.  Vaccine Information Statement  Tdap (Tetanus, Diphtheria, Pertussis) Vaccine  03/11/2020  42 U.S.C. ?? [phone]  Department of Health and Insurance risk surveyor for Disease Control and Prevention  Many vaccine information statements are available in  Spanish and other languages. See PromoAge.com.brwww.immunize.org/vis  Hojas de informaci??n sobre vacunas est??n disponibles en espa??ol y en muchos otros idiomas. Visite PromoAge.com.brwww.immunize.org/vis  Care instructions adapted under license by [facility]. If you have questions about a medical condition or this instruction, always ask your healthcare professional. [employer] disclaims any warranty or liability for your use of this information.

## 2020-10-18 ENCOUNTER — Encounter

## 2020-10-26 ENCOUNTER — Encounter

## 2020-10-26 MED ORDER — CERAVE EX CREA
CUTANEOUS | 2 refills | Status: DC
Start: 2020-10-26 — End: 2021-08-14

## 2020-10-26 NOTE — Telephone Encounter (Signed)
Mcguire Gasparyan is requesting a refill on the following medications:   Requested Prescriptions     Pending Prescriptions Disp Refills   ??? Emollient (CERAVE) CREA 453 g 2     Sig: Apply twice daily as moisturizer to full skin       Last OV 1/26    Future Appointments   Date Time Provider Department Center   11/16/2020  3:30 PM Adelene Amas, MD [facility]   12/29/2020 10:30 AM Otho Darner, PA-C [facility]   01/11/2021  3:30 PM Adelene Amas, MD [facility]

## 2020-11-16 ENCOUNTER — Encounter
Attending: Student in an Organized Health Care Education/Training Program | Primary: Student in an Organized Health Care Education/Training Program

## 2020-12-29 ENCOUNTER — Encounter: Attending: Physician Assistant | Primary: Student in an Organized Health Care Education/Training Program

## 2021-01-11 ENCOUNTER — Ambulatory Visit
Payer: PRIVATE HEALTH INSURANCE | Attending: Student in an Organized Health Care Education/Training Program | Primary: Student in an Organized Health Care Education/Training Program

## 2021-02-07 ENCOUNTER — Encounter
Payer: PRIVATE HEALTH INSURANCE | Attending: Student in an Organized Health Care Education/Training Program | Primary: Student in an Organized Health Care Education/Training Program

## 2021-02-07 NOTE — Progress Notes (Deleted)
Cole Kent (DOB:  May 16, 1986) is a 35 y.o. male,Established patient, here for evaluation of the following chief complaint(s):  No chief complaint on file.    ASSESSMENT/PLAN:    {There are no diagnoses linked to this encounter. (Refresh or delete this SmartLink)}    No follow-ups on file.       Subjective      SUBJECTIVE/OBJECTIVE:    HPI    Mr. Cole Kent, 35 year old male, with previous medical history of prediabetes on Metformin, and HTN on HCTZ.    Presents to the [facility] Medicine clinic with ***    Review of Systems       Objective   Physical Exam       On this date 02/07/2021 I have spent *** minutes reviewing previous notes, test results and face to face with the patient discussing the diagnosis and importance of compliance with the treatment plan as well as documenting on the day of the visit.      An electronic signature was used to authenticate this note.    --Nyoka Cowden, MD

## 2021-05-02 ENCOUNTER — Encounter

## 2021-05-03 MED ORDER — HYDROCHLOROTHIAZIDE 25 MG PO TABS
25 MG | ORAL_TABLET | Freq: Every day | ORAL | 0 refills | Status: AC
Start: 2021-05-03 — End: 2021-07-03

## 2021-05-03 MED ORDER — CLINDAMYCIN PHOSPHATE 1 % EX LOTN
1 % | CUTANEOUS | 3 refills | Status: DC
Start: 2021-05-03 — End: 2021-08-14

## 2021-05-03 MED ORDER — METFORMIN HCL 500 MG PO TABS
500 MG | ORAL_TABLET | Freq: Every day | ORAL | 1 refills | Status: DC
Start: 2021-05-03 — End: 2021-08-14

## 2021-05-03 MED ORDER — ACETAMINOPHEN 500 MG PO TABS
500 MG | ORAL_TABLET | Freq: Three times a day (TID) | ORAL | 0 refills | Status: AC | PRN
Start: 2021-05-03 — End: 2021-08-14

## 2021-05-03 NOTE — Telephone Encounter (Signed)
E-scribe request for med refills. Please review and e-scribe if applicable.     Last Visit Date:  10/11/2020  Next Visit Date:  Visit date not found    Hemoglobin A1C (%)   Date Value   10/11/2020 5.6   04/08/2020 5.9   06/15/2019 7.1 (H)             ( goal A1C is < 7)   No results found for: LABMICR  LDL Cholesterol (mg/dL)   Date Value   50/85/5585 51       (goal LDL is <100)   BUN (mg/dL)   Date Value   75/24/7829 12     BP Readings from Last 3 Encounters:   10/11/20 127/79   09/13/20 137/86   08/31/20 134/83          (goal 120/80)        Patient Active Problem List:     Hidradenitis     Type 2 diabetes mellitus without complication, without long-term current use of insulin (HCC)      ----Cole Kent

## 2021-07-01 ENCOUNTER — Encounter

## 2021-07-03 MED ORDER — HYDROCHLOROTHIAZIDE 25 MG PO TABS
25 MG | ORAL_TABLET | ORAL | 0 refills | Status: AC
Start: 2021-07-03 — End: 2021-08-14

## 2021-07-03 NOTE — Telephone Encounter (Signed)
E-scribe request for med refill. Please review and e-scribe if applicable.     Last Visit Date:  10/11/2020  Next Visit Date:  Visit date not found    Hemoglobin A1C (%)   Date Value   10/11/2020 5.6   04/08/2020 5.9   06/15/2019 7.1 (H)             ( goal A1C is < 7)   No results found for: LABMICR  LDL Cholesterol (mg/dL)   Date Value   87/56/4332 51       (goal LDL is <100)   BUN (mg/dL)   Date Value   95/18/8416 12     BP Readings from Last 3 Encounters:   10/11/20 127/79   09/13/20 137/86   08/31/20 134/83          (goal 120/80)        Patient Active Problem List:     Hidradenitis     Type 2 diabetes mellitus without complication, without long-term current use of insulin (HCC)      ----Dorma Russell

## 2021-08-14 ENCOUNTER — Encounter

## 2021-08-15 MED ORDER — HYDROCHLOROTHIAZIDE 25 MG PO TABS
25 MG | ORAL_TABLET | Freq: Every day | ORAL | 3 refills | Status: DC
Start: 2021-08-15 — End: 2022-01-08

## 2021-08-15 MED ORDER — ACETAMINOPHEN 500 MG PO TABS
500 MG | ORAL_TABLET | Freq: Three times a day (TID) | ORAL | 0 refills | Status: DC | PRN
Start: 2021-08-15 — End: 2022-01-08

## 2021-08-15 MED ORDER — CLINDAMYCIN PHOSPHATE 1 % EX LOTN
1 % | CUTANEOUS | 3 refills | Status: AC
Start: 2021-08-15 — End: 2022-01-08

## 2021-08-15 MED ORDER — METFORMIN HCL 500 MG PO TABS
500 MG | ORAL_TABLET | Freq: Every day | ORAL | 1 refills | Status: DC
Start: 2021-08-15 — End: 2023-07-01

## 2021-08-15 MED ORDER — CERAVE EX CREA
CUTANEOUS | 2 refills | Status: AC
Start: 2021-08-15 — End: 2022-01-08

## 2021-08-15 NOTE — Telephone Encounter (Signed)
E-scribe request for med refills. Please review and e-scribe if applicable.     Last Visit Date:  10/11/20  Next Visit Date:  Visit date not found    Hemoglobin A1C (%)   Date Value   10/11/2020 5.6   04/08/2020 5.9   06/15/2019 7.1 (H)             ( goal A1C is < 7)   No results found for: LABMICR  LDL Cholesterol (mg/dL)   Date Value   16/05/9603 51       (goal LDL is <100)   BUN (mg/dL)   Date Value   54/04/8118 12     BP Readings from Last 3 Encounters:   10/11/20 127/79   09/13/20 137/86   08/31/20 134/83          (goal 120/80)        Patient Active Problem List:     Hidradenitis     Type 2 diabetes mellitus without complication, without long-term current use of insulin (HCC)      ----Dorma Russell

## 2021-08-15 NOTE — Telephone Encounter (Signed)
E-scribe request for med refills. Please review and e-scribe if applicable.     Last Visit Date:  10/11/20  Next Visit Date:  08/14/2021    Hemoglobin A1C (%)   Date Value   10/11/2020 5.6   04/08/2020 5.9   06/15/2019 7.1 (H)             ( goal A1C is < 7)   No results found for: LABMICR  LDL Cholesterol (mg/dL)   Date Value   28/00/3491 51       (goal LDL is <100)   BUN (mg/dL)   Date Value   79/15/0569 12     BP Readings from Last 3 Encounters:   10/11/20 127/79   09/13/20 137/86   08/31/20 134/83          (goal 120/80)        Patient Active Problem List:     Hidradenitis     Type 2 diabetes mellitus without complication, without long-term current use of insulin (HCC)      ----Cole Kent

## 2021-08-23 NOTE — Progress Notes (Deleted)
No chief complaint on file.      HPI:    36 year old male presenting to establish as a new patient and for a comprehensive update of their health maintenance and preventative care and for management of medical issues as described below.     ROS:  ***         Review of patient's allergies indicates:  Not on File    No current outpatient medications on file.    There is no problem list on file for this patient.      No past medical history on file.    No past surgical history on file.             Occupation: ***  Hobbies/interests and physical activities: ***  Regular Exercise: ***  Type: ***  Frequency: ***  Diet Restrictions: ***  Marital status: ***  Number of Children: ***  Lives with: ***  Sexually active: ***  Sexual partners: ***  History of STD's: ***    VS:  There were no vitals taken for this visit.    PE:  ***    A/P:    ***    HM:  Physical 08/30/21  Lipid panel  CMP  CBC  Hep C screen  HIV screen  Influenza yearly  COVID-19 vaccines  Tdap

## 2021-08-30 ENCOUNTER — Encounter (INDEPENDENT_AMBULATORY_CARE_PROVIDER_SITE_OTHER): Payer: Self-pay | Admitting: Family Medicine

## 2021-09-20 ENCOUNTER — Encounter (INDEPENDENT_AMBULATORY_CARE_PROVIDER_SITE_OTHER): Payer: Self-pay | Admitting: Family Health

## 2021-10-27 ENCOUNTER — Telehealth (HOSPITAL_BASED_OUTPATIENT_CLINIC_OR_DEPARTMENT_OTHER): Payer: Self-pay | Admitting: Gastroenterology

## 2021-10-27 NOTE — Telephone Encounter (Signed)
Contacted pt and discussed referral, pts wife says will reach out to referring provider.

## 2021-10-27 NOTE — Telephone Encounter (Signed)
RETURN CALL: Voicemail - Detailed Message      SUBJECT:  Referral Request/Questions      REASON FOR REFERRAL: cyclic vomiting sydrome  NAME OF CLINIC/SPECIALTY: [facility]  PROVIDER: Lauraine Rinne MD  PHONE: n/a  FAX: n/a  ADDITIONAL INFORMATION: Patient's wife Lanora Manis called to check on status of referral sent by [facility] on 10/20/21. Please call back to confirm receipt and ETA for scheduling.

## 2021-11-03 ENCOUNTER — Encounter (HOSPITAL_BASED_OUTPATIENT_CLINIC_OR_DEPARTMENT_OTHER): Payer: Self-pay

## 2021-11-03 DIAGNOSIS — R1115 Cyclical vomiting syndrome unrelated to migraine: Secondary | ICD-10-CM

## 2021-12-11 ENCOUNTER — Ambulatory Visit (INDEPENDENT_AMBULATORY_CARE_PROVIDER_SITE_OTHER): Payer: Self-pay | Admitting: Internal Medicine

## 2021-12-12 NOTE — Progress Notes (Signed)
INITIAL GASTROENTEROLOGY CLINIC NOTE    DATE OF SERVICE: 12/12/2021  REFERRING PHYSICIAN: Chauncey Cruel Parsons, ARNP  REASON FOR REFERRAL: cyclic vomiting syndrome  Patient is seen in consultation at the request of Hunter Parsons, ARNP  for evaluation of  cyclic vomiting syndrome      ID/CC:  Hunter Parsons  36 year old YO male       I conducted this encounter from my home via secure, live, face-to-face video conference with the patient who was located at home, with wife.  I reviewed the risks and benefits of telemedicine as pertinent to this visit and the patient agreed to proceed.    HPI:  Hunter Parsons is a 36 year old male referred for evaluation of  cyclic vomiting syndrome.  He says his episodes of vomiting began during his teen years. They have always been episodic, most recently occurring every 3-4 months. They attacks have been stereotypical. They are provoked either when he is having a bowel movement or during or after he has just eaten a meal. There is no prodromal period. He suddenly experiences pain just below and to the left of his umbilicus and nausea. Vomiting begins within a minute. Between episodes he has felt normal. Episodes used to occur once a year but have progressively become more frequent. He has required ICU care for acute renal failure and was most recently hospitalized in March, 2023 with that complication.  He now periodically throws up during his "normal period" though that is infrequent. He had smoked marijuana for years but has not smoked in the past 2 months. He says he went a period of 10 years without smoking and still had the vomiting episodes.     He has lost 40# in the past year. He thinks this is because he goes for long periods when in an episode without eating. However, he also thinks he has been losing weight even when not sick with vomiting. He endorses having occasional early satiety and does fairly frequently vomit old ingesta. That is most common if attacks  start in the early AM when he will vomit undigested frood from his supper meal. He denies a history of peptic ulcer or H pylori. He denies NSAID use. Episodes have resulted in acute kidney failure from dehydration.    His bowel movements are either constipated or diarrhea, gooey, hard to wipe away. Has seen blood mixed in with stool on occasion over the past 2-3 years about the same time as his irregular bowel habit developed. He hs not had colonscopy.       Esophagus: no pyrosis or dysphagia  Small bowel: no past problems  ColonBiliary: No problems noted  Liver: no history of hepatitis or jaundice. No FH of CLD  Pancreas: no past history of problems        Past studies:    07/07/19 EGD:  Normal esophagus.   Mild gastritis, no erosions.   Normal duodenum.      Path:   1.  Duodenum, biopsy:  -Duodenal mucosa with focal foveolar metaplasia.  -No significant inflammatory change or villous abnormalities noted.  -Negative for dysplasia or malignancy.    2.  Stomach, biopsy:  -Gastric mucosa with no significant pathologic abnormality.  -No Helicobacter organisms identified on routine staining.  -Negative for intestinal metaplasia, dysplasia or malignancy.    Porphobilinogen, Rand Ur   0.0 - 2.0 mg/L 0.     10/17/21 ABD CT wo contrast: No acute abdominal pathology.  10/17/21:    Q-T INTERVAL (CORRECTED)   ms 428        07/07/19 Head CT wo contrast: No intracranial abnormality evident.     CVS data base:  Age at onset: in teens  Age at diagnosis:      Spells:  # vomiting spells/month: every 2-3 months but more frequent recently  Duration of spells (hrs): 24-48 hours. The last two were longer and lasted 1 1/2 month this timeseveral weeks. They are becoming more severe. His last episode occurred a week ago.  Personal migraine history:  NO  Family migraine history: YES mother  Current asymptomatic intervals: YES  Marijuana use:  YES  None in 2 months. He didn't smoke for a period of 10 years and still had episodes                  Abdominal pain:  YES       If yes:          Location: peri-umbilical, a litlle below and to the left                        Severity:  starts mild as fast symptom, can be severe        Onset: concurrent with vomiting                       Used narcotics for pain control: only in hospital                 Diarrhea with spell:  either that or constipated  Triggers: YES used to think fried food. None now       If yes: stress: Negative  But the effect is delayed up to a week  Response to hot bath or shower: YES  Relieves while in water. Cold water can also work.  Response to sleep               Termination: NO               Interval abdominal pain: NO  Interval nausea: NO  Current disability due to symptoms: YES    Prodrome: Probably not             Meds used:                                       Maximum dose                 Effect  Ondansetron   8 mg  No more +   Promethazine 50 mg pill -   Prochlorperazine -    Lorazepam 1 mg - Not now. Can cause seizures   clonazepam -    diphenhydramine 50 mg -   Metoclopramide  Imitrex  apepitant  Other -  Nasal spray  -     Lowered pain and nausea     Response to sleep: NO     Prophylactic Treatment: YES        Drug                                    Max dose  Effect                                                                                                                         amitriptyline -    Nortriptyline   -    Propranolol 40 tid  1 year. For anxiety -   Topiramate -    Zonisamide -    Leviteracetam    scopalamine Taken a long time ago  for seizure. Had behavior changes   A little. But still to use hospital   Co-enzyme Q  L-carnitine    Other -  -      Testing:       Endoscopy:                      EGD: YES                          Colonosopy: NO                                PMH    Allergy: cats      Surgery: none    FH: 3 children; 2 sisters, one died from brain disease; mother has depression    Social History: married;now  unemplyed    Habits:      Tobacco: e cigarettes, daily      Alcohol: none      Drugs: none.  X 2 months    Meds:  .  ARIPiprazole 15 MG tablet, Take 1 tablet (15 mg) by mouth., Disp: , Rfl:   .  busPIRone 15 MG tablet, Take 1 tablet (15 mg) by mouth 4 times a day., Disp: , Rfl:   .  LORazepam 1 MG tablet, Take 1 tablet (1 mg) by mouth 2 times a day as needed., Disp: , Rfl:   .  ondansetron 8 MG disintegrating tablet, Dissolve 2 tablets (16 mg) on top of tongue and swallow 2 times a day as needed., Disp: , Rfl:   .  promethazine 50 MG tablet, Take 1 tablet (50 mg) by mouth every 6 hours as needed., Disp: , Rfl:   .  propranolol 20 MG tablet, Take by mouth., Disp: , Rfl:   .  traZODone 100 MG tablet, Take 1-2 tablets (100-200 mg) by mouth., Disp: , Rfl:       REVIEW OF SYSTEMS:    Head: no headache, trauma  Eyes: no vision problems  ENT: normal mucosa; pharynx benign; dentition normal  Neck: no pain, stiffness or swelling  Chest: no cough, pain, shortness of breath  Heart: no history of angina, MI or arrhythmia. No history of heart murmur  Vascular: no arterial or venous disease  GU: Hx acute kidney injury during a CVS episode.  no dysuria, hematuria,  Joints: knees crack  Neurologic: no focal weakness, gait disturbance or sensory abnormalities; history of  seizure disorder throught life; , no stroke  Endocrine: no diabetes, thyroid disease or other abnormality  Skin: no rashes  Psych: depression and anxiety          Physical Exam: not done    Impressions:      (R11.15) Cyclic vomiting syndrome  Comment: This is possibly cyclic vomiting syndrome (CVS). The stereotypical onset of spells,  FH of migraine, normal intervening periods and lack of other identifiable organic or metabolic causes are all compatible. Abdominal pain is also a very frequent symptom in CVS. Cannabinoid hyperemesis syndrome (CHS) is not likely here based on the patients statement that during a 10 year period of abstinence he continued to have  vomiting episodes. Gastroparesis is also a consideration as he endorses having both satiety and vomiting of old ingesta. He has not had a gastric emptying study to date. Functional nausea and vomiting syndrome is also unlikely based on the intervening normal periods. His esponse to hot bathing is also a supportive symptom seen in both CVS. However, he states that cold water bathing can also be effective in relieving symptoms, a finding that is rare but has been reported in CVS.His report of irregular bowel habits and intermittent hematochezia need to be investigated as they could suggest another etiology for his symptoms. His progressive weight loss is also a concern. He does not think that episodes of vomiting are the root cause as he continues losing weight during periods when he isn't vomiting.      Plan:     1) Trial of  topiramate 25 MG at hs increasing weekly by 25 mg to 100 mg  2) Use Imitrex spray immediately at onset of symptoms with head held level.  3) If Imitrex fails, trial of 16 mg ondansetron lingual.    4) Gastric emptying study         (K92.1) Hematochezia  (primary encounter diagnosis)  Comment: This has been present intermittently for 2-3 years.  Plan: Colonoscopy          (R63.4) Weight loss  Comment: As above.        Follow-up visit in 6-8 weeks    Nicholes Calamity MD  Associate Clinical Professor  Division of Gastroenterology  [facility]

## 2021-12-14 ENCOUNTER — Ambulatory Visit: Payer: No Typology Code available for payment source | Attending: Gastroenterology | Admitting: Gastroenterology

## 2021-12-14 DIAGNOSIS — R1115 Cyclical vomiting syndrome unrelated to migraine: Secondary | ICD-10-CM | POA: Insufficient documentation

## 2021-12-14 DIAGNOSIS — K921 Melena: Secondary | ICD-10-CM | POA: Insufficient documentation

## 2021-12-14 DIAGNOSIS — R634 Abnormal weight loss: Secondary | ICD-10-CM | POA: Insufficient documentation

## 2021-12-14 MED ORDER — TOPIRAMATE 25 MG OR TABS
ORAL_TABLET | ORAL | 1 refills | Status: AC
Start: 2021-12-14 — End: ?

## 2021-12-15 DIAGNOSIS — R634 Abnormal weight loss: Secondary | ICD-10-CM | POA: Insufficient documentation

## 2021-12-15 DIAGNOSIS — Z87448 Personal history of other diseases of urinary system: Secondary | ICD-10-CM | POA: Insufficient documentation

## 2021-12-15 DIAGNOSIS — R1115 Cyclical vomiting syndrome unrelated to migraine: Secondary | ICD-10-CM | POA: Insufficient documentation

## 2021-12-15 DIAGNOSIS — K921 Melena: Secondary | ICD-10-CM | POA: Insufficient documentation

## 2021-12-15 DIAGNOSIS — F32A Anxiety disorder, unspecified: Secondary | ICD-10-CM | POA: Insufficient documentation

## 2021-12-15 NOTE — Patient Instructions (Addendum)
Thank you for coming in today. You may have a condition called cyclic vomiting syndrome. Please read the information I've included below. There are some other possibilities, including gastroparesis (poorly emptying stomach). The blood you have in your stool could also be due to a condition that can cause nausea and vomiting.    For further evaluation please have the following tests done:      Imaging: Stomach emptying test. We'll call you to schedule this    Colonoscopy: We'll call you to schedule this    For treatment:    1) Trial of  topiramate 25 MG (I tablet). Start taking one tablet at bedtime. Each week increase by one tablet to a maximum of 4 tablets.     2) Use Imitrex spray immediately at onset of symptoms with head held level.    3) If Imitrex fails, trial of 16 mg (2 tablets) ondansetron on your tongue           If you have side effects of medications or other questions please call [phone] to report.    Please arrange a follow-up clinic visit in 6-8 weeks.                                                Cyclic Vomiting Syndrome: information for patients       Background:       Cyclic vomiting syndrome (CVS) is a condition of unknown cause. Recurring spells of severe vomiting, often associated with abdominal pain, alternate with periods of complete normalcy. Tests for an underlying disease are negative. There is often a family history of migraine headaches. Because of that and many other shared features, our current theory is that CVS and migraine are likely different expressions of a similar disorder. However, the exact cause of CVS remains unknown.      CVS has been seen in children for over a century. Its occurrence in adults, however, has been recognized only sine the 1980's. Consequently, many physicians are unfamiliar with it. Because it is uncommon, even gastroenterology specialists see few cases, so their experience treating  it is often quite limited. It can be confused with chronic nausea and  vomiting and many other abdominal conditions, particularly when there is associated abdominal pain.      Certain diagnostic tests are routinely done, such as upper endoscopy, abdominal ultrasound, brain CT scan and selected blood and urine tests. However, no test is specific for the condition. Diagnosis is mainly based on its symptom pattern      Phases of CVS:      There are 4 major phases during the course of CVS:   1) A normal period or asymptomatic interval between episodes of vomiting.   2) A "prodrome"(warning symptoms)  preceding a vomiting spell during which the patient knows vomiting will begin. It may consist of nausea, pallor or a feeling of ill ease or sweating. It may last minutes up to days in some cases.  However, some patients have no prodrome at all.  3) The vomiting spell.  4) A recovery phase in which vomiting can begin again if the stomach is not treated gently.            Vomiting spells in CVS tend to be "stereotypical", that is they tend to occur at the same time of day, frequently in early  morning and often last about the same length of time if not treated. There may be triggers such as emotional stress or elation, lack of sleep, menstruation, or another illness. Many patients report that during the prodrome or vomiting spell, relaxing in a hot bath or shower relieves symptoms. Falling asleep frequently relieves or diminishes the intensity of vomiting spells. However, once vomiting begins intravenous medication and fluids in an emergency room setting are usually required to end an episode either permanently or temporarily. Likewise, when abdominal pain is severe, only the intravenous treatment route can relieve symptoms.         If CVS spells keep happening, treatment is ineffective and/or care givers fail to diagnose it correctly, the normal asymptomatic intervals may become shorter or vanish almost altogether. Patients then experience a dread of recurrence called "anticipatory anxiety". In  this setting, constant nausea may become disabling. It is especially difficult when patients are thought to be malingering, a condition in which patients fake disease symptoms.  A patient seen inducing vomiting, an action that sometimes relieves abdominal pain, may be thought to be faking symptoms in order to gain sympathy or drugs.      Treatment and prevention:    If specific triggers are present, avoiding them may help prevent an attack.     Specific treatment depends on the disease phase.  1) During asymptomatic intervals certain medications such as amitriptyline (Elavil), propranolol (Inderal)  and periactin (mainly in children)  may be taken to prevent or reduce the severity and frequency of vomiting spells. Other medications( but not limited to) such as topiramate (Topamax), zonisamide and hydroxyzine (Vistaril) may be beneficial.  2) During the prodrome, high dose ondansetron (Zofran), diphenhydramine (Benadryl) and promethazine (Phenergan) are helpful. Inducing sleep with a drug like lorazepam (Ativan) may effectively prevent the onset of vomiting. Sumatriptan may also be effective.The keys to treatment success are using large medication doses promptly at the onset of a prodrome. An NSAID such as ibuprofen or a narcotic may alleviate pain. Resting in a quiet darkened room also helps.  3) Once a vomiting spell begins immediate treatment via an intravenous route is mandatory. The sooner treatment starts the better. Emergency room stay times can be greatly reduced and hospitalizations prevented by vigorous, prompt intravenous treatment. I advise patients to carry a note, stating that they have CVS and advising a specific course of treatment. Not all emergency physicians are open to such suggestions, but many actually welcome them.    4) During the recovery phase, eating should be undertaken very gradually because vomiting and pain may start again if food is pushed too quickly. Adequate fluid replacement is far  more important. For some patients and their families, the support of a psychologist can be very helpful in dealing with chronic symptoms and their effects on relationships, both at home and at work.          As there is no cure for CVS, prevention of episodes is a major goal of treatment. Most patients do benefit from treatment. As there is no "cook book" recipe for treatment success, trials with different drugs at various doses are usually necessary. Patience is required on all parts! Unfortunately, some patients in the chronic phase who have developed anticipatory anxiety feel ill a lot of the time. An early diagnosis and adequate treatment may prevent this. Just knowing that their condition is "real" is helpful to patients, their families and treating health-care providers.       Information and  assistance with many aspects of CVS are available through the Cyclic Vomiting Syndrome Association. Email: cvsa@cvsaonline .org                                                       www. Cvsaonline.org                                                       [phone]                                                       Fax: [phone]

## 2021-12-19 ENCOUNTER — Telehealth (HOSPITAL_COMMUNITY): Payer: Self-pay

## 2021-12-19 DIAGNOSIS — K921 Melena: Secondary | ICD-10-CM

## 2021-12-19 NOTE — Telephone Encounter (Addendum)
GI Health Assessment Questions    There is no height or weight on file to calculate BMI.    HT: 5'9"  WT:145 LBS  BMI: 21.4    Patient Assessment      [facility] CASES   If scheduling >3 weeks out, tell the patient following script:  If you have a positive COVID-19 test, an exposure to COVID-19, or develop symptoms of COVID-19 within 3 weeks of your procedure date, please give [place] a call at [phone].    If scheduling within 3 weeks, ask the following:  Have you tested POSITIVE for Covid-19 and/or have symptoms of COVID-19 in the last 3 weeks? No   What was the date of your positive result?   Were you hospitalized due to COVID-19 complications?   If yes to hospitalization: DO NOT SCHEDULE - send TE to Beatrice Lecher   If no to hospitalization:    Are you immunocompromised?   -  if no, and order is routine ok to schedule 11 days after pt's positive date. If yes to being immunocompromised and non-urgent, ok to schedule 21 days after pt's positive date.      If patient is URGENT: follow urgent scheduling workflow from GI [facility] Resource Book        Have you had a previous Endoscopy? No  If yes:  Where?   When?   Procedure Type?     Are you taking any Anti-coagulant or Anti-Platelet Medications? No  If yes, which medication are you taking:    Prescribing provider:    Inform patient to call the doctor who prescribed these medicines and ask for special instructions for your blood-thinning medicines     Are you on dialysis? No  If yes to any kind of Dialysis, OK to schedule. Advised patient to come dry/drained.   [facility] - DO NOT SCHEDULE  [facility] - Inform pt to go to the lab before their procedure for a potassium blood draw, route TE to Charge Nurse Pool for further review, add note in appointment line  [facility] - note in appointment line    Do you have a Pacemaker or Defibrillator? No  If yes, what kind of device do you have (Ex. [address] [place], [employer])?    If yes, name of Cardiologist?    Internal [place] Cardiologist, update  appt note line to "Internal Device - device type", if patient is scheduled with sedation route TE to "p [facility] Charge Nurse Pool" for further review  External Cardiologist, update appt note line to "External Device - device type", fax device form to patient's provider, and route TE to Charge Nurse Pool for further review  If yes to pacemaker/defibrillator, DO NOT SCHEDULE AT [facility]     Are you Diabetic?  No  Inform patient to call the doctor who prescribed these medicines and ask for special instructions for your diabetes medications     Are you a difficult IV start? No  Do you require an ultrasound machine to assist with IV start? No  If yes to ultrasound start, arranged for 90 min early arrival time?    If yes to diff. IV/a few pokes, arranged for 60 min early arrival time?    If yes to ultrasound, DO NOT SCHEDULE AT [facility] & DO NOT SCHEDULE FIRST CASE OF THE DAY    Are you able to transfer to a stretcher independently? No  If no, DO NOT SCHEDULE AT [facility]     Is the patient scheduled  for an ERCP? No  If yes, do you have an allergy to contrast, iodine or shellfish? If yes, what are your symptoms? No  If yes, ok to schedule. Routed TE to Interventional Pool for further evaluation of contrast allergies? No    Is the patient scheduled for an ALS PEG or PEG? No  If yes, do you have a CPAP/BIPAP? No  If yes, are you able to remove your CPAP or BiPAP mask on your own? No  If no, arranged for an ICU bed post procedure? No    Is the patient scheduled for a Therapeutic procedure? No  If yes, please obtain IMAGING.    PROCEDURE        Procedure MD:  Dr. Hansel Starling  Procedure Type: Colonoscopy  Procedure Date:   06/26/2022       Time:    1400  Procedure Check-In Time:  1330    Patient Teaching    Was the topic of blood thinners taught?  Yes     Reminder that family member or friend must escort patient? Yes     What is the name of their driver? Spouse    How were the procedure preparation instruction materials delivered?  Verbal:  No  On Paper: No  eCare message: Yes  Email: No  E-mail address:       ZipWhip Appointment Confirmation Sent: Yes     Does this procedure require bowel prep? Yes  If yes, were instructions for the ordered laxative taught? No  If yes, which RX was prescribed? Golytely  Pharmacy?  [facility] [place]      General Notes:

## 2022-01-08 ENCOUNTER — Encounter

## 2022-01-09 MED ORDER — CERAVE EX CREA
CUTANEOUS | 2 refills | Status: AC
Start: 2022-01-09 — End: ?

## 2022-01-09 MED ORDER — ACETAMINOPHEN 500 MG PO TABS
500 MG | ORAL_TABLET | Freq: Three times a day (TID) | ORAL | 0 refills | Status: AC | PRN
Start: 2022-01-09 — End: ?

## 2022-01-09 MED ORDER — HYDROCHLOROTHIAZIDE 25 MG PO TABS
25 MG | ORAL_TABLET | Freq: Every day | ORAL | 3 refills | Status: AC
Start: 2022-01-09 — End: 2023-07-01

## 2022-01-09 MED ORDER — CLINDAMYCIN PHOSPHATE 1 % EX LOTN
1 % | CUTANEOUS | 3 refills | Status: AC
Start: 2022-01-09 — End: 2023-07-01

## 2022-01-09 NOTE — Telephone Encounter (Signed)
Patient notified, appointment set

## 2022-01-09 NOTE — Telephone Encounter (Signed)
Last visit: [id number]  Last Med refill: [id number]  Does patient have enough medication for 72 hours: No:     Next Visit Date:  Future Appointments   Date Time Provider Department Center   01/30/2022  3:15 PM Adelene Amas, MD [id number]       Health Maintenance   Topic Date Due    COVID-19 Vaccine (1) Never done    Varicella vaccine (1 of 2 - 2-dose childhood series) Never done    Diabetic Alb to Cr ratio (uACR) test  Never done    Diabetic retinal exam  Never done    Hepatitis B vaccine (2 of 3 - Risk 3-dose series) 11/08/2020    Lipids  04/08/2021    Depression Screen  05/06/2021    GFR test (Diabetes, CKD 3-4, OR last GFR 15-59)  07/11/2021    Diabetic foot exam  10/11/2021    A1C test (Diabetic or Prediabetic)  10/11/2021    Flu vaccine (Season Ended) 03/06/2022    DTaP/Tdap/Td vaccine (2 - Td or Tdap) 10/12/2030    Pneumococcal 0-64 years Vaccine  Completed    Hepatitis C screen  Completed    HIV screen  Completed    Hepatitis A vaccine  Aged Out    Hib vaccine  Aged Out    Meningococcal (ACWY) vaccine  Aged Out       Hemoglobin A1C (%)   Date Value   10/11/2020 5.6   04/08/2020 5.9   06/15/2019 7.1 (H)             ( goal A1C is < 7)   No results found for: LABMICR  LDL Cholesterol (mg/dL)   Date Value   38/18/2993 51       (goal LDL is <100)   BUN (mg/dL)   Date Value   71/69/6789 12     BP Readings from Last 3 Encounters:   10/11/20 127/79   09/13/20 137/86   08/31/20 134/83          (goal 120/80)    All Future Testing planned in [facility]  Lab Frequency Next Occurrence               Patient Active Problem List:     Hidradenitis     Type 2 diabetes mellitus without complication, without long-term current use of insulin (HCC)

## 2022-01-09 NOTE — Telephone Encounter (Signed)
Please schedule appt for refills

## 2022-01-09 NOTE — Telephone Encounter (Signed)
Erol Flanagin is calling to request a refill on the following medication(s):    Medication Request:  Requested Prescriptions     Pending Prescriptions Disp Refills    benzoyl peroxide 5 % external liquid 227 g 11     Sig: Wash affected areas once daily    doxycycline hyclate (VIBRAMYCIN) 100 MG capsule 60 capsule 2     Sig: Take 1 pill twice daily with food       Last Visit Date (If Applicable):  08/31/2020    Next Visit Date:    Visit date not found

## 2022-01-09 NOTE — Telephone Encounter (Signed)
Last visit: [id number]  Last Med refill: [id number]  Does patient have enough medication for 72 hours: No:     Next Visit Date:  No future appointments.    Health Maintenance   Topic Date Due    COVID-19 Vaccine (1) Never done    Varicella vaccine (1 of 2 - 2-dose childhood series) Never done    Diabetic Alb to Cr ratio (uACR) test  Never done    Diabetic retinal exam  Never done    Hepatitis B vaccine (2 of 3 - Risk 3-dose series) 11/08/2020    Lipids  04/08/2021    Depression Screen  05/06/2021    GFR test (Diabetes, CKD 3-4, OR last GFR 15-59)  07/11/2021    Diabetic foot exam  10/11/2021    A1C test (Diabetic or Prediabetic)  10/11/2021    Flu vaccine (Season Ended) 03/06/2022    DTaP/Tdap/Td vaccine (2 - Td or Tdap) 10/12/2030    Pneumococcal 0-64 years Vaccine  Completed    Hepatitis C screen  Completed    HIV screen  Completed    Hepatitis A vaccine  Aged Out    Hib vaccine  Aged Out    Meningococcal (ACWY) vaccine  Aged Out       Hemoglobin A1C (%)   Date Value   10/11/2020 5.6   04/08/2020 5.9   06/15/2019 7.1 (H)             ( goal A1C is < 7)   No results found for: LABMICR  LDL Cholesterol (mg/dL)   Date Value   79/15/0569 51       (goal LDL is <100)   BUN (mg/dL)   Date Value   79/48/0165 12     BP Readings from Last 3 Encounters:   10/11/20 127/79   09/13/20 137/86   08/31/20 134/83          (goal 120/80)    All Future Testing planned in [facility]  Lab Frequency Next Occurrence               Patient Active Problem List:     Hidradenitis     Type 2 diabetes mellitus without complication, without long-term current use of insulin (HCC)

## 2022-01-19 ENCOUNTER — Inpatient Hospital Stay (HOSPITAL_COMMUNITY)
Admit: 2022-01-19 | Discharge: 2022-01-19 | Disposition: A | Discharge status: Home / Self Care | Payer: No Typology Code available for payment source | Source: Home / Self Care

## 2022-01-19 ENCOUNTER — Inpatient Hospital Stay
Admission: RE | Admit: 2022-01-19 | Discharge: 2022-01-19 | Disposition: A | Discharge status: Home / Self Care | Payer: No Typology Code available for payment source | Attending: Nuclear Medicine | Admitting: Nuclear Medicine

## 2022-01-19 DIAGNOSIS — R1115 Cyclical vomiting syndrome unrelated to migraine: Secondary | ICD-10-CM | POA: Insufficient documentation

## 2022-01-19 DIAGNOSIS — R6881 Early satiety: Secondary | ICD-10-CM

## 2022-01-19 DIAGNOSIS — R1110 Vomiting, unspecified: Secondary | ICD-10-CM

## 2022-01-19 MED ORDER — TECHNETIUM TC-99M SULFUR COLLOID UWM SOLUTION
0.5000 | Freq: Once | Status: AC | PRN
Start: 2022-01-19 — End: 2022-01-19
  Administered 2022-01-19: 0.5 via ORAL

## 2022-01-30 ENCOUNTER — Inpatient Hospital Stay: Payer: MEDICAID | Primary: Student in an Organized Health Care Education/Training Program

## 2022-01-30 ENCOUNTER — Ambulatory Visit
Admit: 2022-01-30 | Discharge: 2022-01-30 | Payer: PRIVATE HEALTH INSURANCE | Attending: Student in an Organized Health Care Education/Training Program | Primary: Student in an Organized Health Care Education/Training Program

## 2022-01-30 DIAGNOSIS — E119 Type 2 diabetes mellitus without complications: Secondary | ICD-10-CM

## 2022-01-30 LAB — POCT GLYCOSYLATED HEMOGLOBIN (HGB A1C): Hemoglobin A1C: 5.7 %

## 2022-01-30 LAB — LIPID PANEL
Chol/HDL Ratio: 2.6 (ref <5)
Cholesterol: 138 mg/dL (ref <200)
HDL: 54 mg/dL (ref >40)
LDL Cholesterol: 63 mg/dL (ref 0–130)
Triglycerides: 103 mg/dL (ref <150)

## 2022-01-30 LAB — BASIC METABOLIC PANEL
Anion Gap: 15 mmol/L (ref 9–17)
BUN: 7 mg/dL (ref 6–20)
CO2: 23 mmol/L (ref 20–31)
Calcium: 9 mg/dL (ref 8.6–10.4)
Chloride: 103 mmol/L (ref 98–107)
Creatinine: 0.99 mg/dL (ref 0.70–1.20)
Est, Glom Filt Rate: >60 mL/min/{1.73_m2} (ref >60)
Glucose: 96 mg/dL (ref 70–99)
Potassium: 3.8 mmol/L (ref 3.7–5.3)
Sodium: 141 mmol/L (ref 135–144)

## 2022-01-30 MED ORDER — BENZOYL PEROXIDE 5 % EX LIQD
5 % | CUTANEOUS | 11 refills | Status: AC
Start: 2022-01-30 — End: 2023-07-01

## 2022-01-30 NOTE — Progress Notes (Signed)
Attending Physician Statement  I have discussed the care of Cole Kent, including pertinent history and exam findings,  with the resident. I have reviewed the key elements of all parts of the encounter with the resident.  I agree with the assessment, plan and orders as documented by the resident.  (GE Modifier)    Benard Rink, MD

## 2022-01-30 NOTE — Progress Notes (Signed)
Diabetic visit information    BP Readings from Last 3 Encounters:   10/11/20 127/79   09/13/20 137/86   08/31/20 134/83       Hemoglobin A1C (%)   Date Value   10/11/2020 5.6   04/08/2020 5.9   06/15/2019 7.1 (H)     LDL Cholesterol (mg/dL)   Date Value   13/03/6577 51               Have you changed or started any medications since your last visit including any over-the-counter medicines, vitamins, or herbal medicines? no   Have you stopped taking any of your medications? Is so, why? -  no  Are you having any side effects from any of your medications? - no    Have you seen any other physician or provider since your last visit?  no   Have you had any other diagnostic tests since your last visit?  no   Have you been seen in the emergency room and/or had an admission in a hospital since we last saw you?  no     Have you had your annual diabetic retinal (eye) exam? No   (ensure copy of exam is in the chart)    Have you had your routine dental cleaning in the past 6 months? no    Do you have an active MyChart account?  If not, what are your barriers?  Yes    Patient Care Team:  Adelene Amas, MD as PCP - General (Family Medicine)    Medical history Review  Past Medical, Family, and Social History reviewed and does not contribute to the patient presenting condition.    Health Maintenance   Topic Date Due    COVID-19 Vaccine (1) Never done    Varicella vaccine (1 of 2 - 2-dose childhood series) Never done    Diabetic Alb to Cr ratio (uACR) test  Never done    Diabetic retinal exam  Never done    Hepatitis B vaccine (2 of 3 - Risk 3-dose series) 11/08/2020    Lipids  04/08/2021    GFR test (Diabetes, CKD 3-4, OR last GFR 15-59)  07/11/2021    Diabetic foot exam  10/11/2021    A1C test (Diabetic or Prediabetic)  10/11/2021    Flu vaccine (Season Ended) 03/06/2022    Depression Screen  01/28/2023    DTaP/Tdap/Td vaccine (2 - Td or Tdap) 10/12/2030    Pneumococcal 0-64 years Vaccine  Completed    Hepatitis C screen  Completed     HIV screen  Completed    Hepatitis A vaccine  Aged Out    Hib vaccine  Aged Out    Meningococcal (ACWY) vaccine  Aged Out

## 2022-01-30 NOTE — Progress Notes (Signed)
Cole Kent (DOB:  07/21/1986) is a 36 y.o. male,Established patient, here for evaluation of the following chief complaint(s):  Diabetes (Follow up / Patient would like to discuss bright lights hurting his eyes )    ASSESSMENT/PLAN:    1. Type 2 diabetes mellitus without complication, without long-term current use of insulin (HCC)  -     POCT glycosylated hemoglobin (Hb A1C)  -     Basic Metabolic Panel; Future  -     Microalbumin, Ur; Future  -     Lipid Panel; Future  2. Hidradenitis suppurativa  -     benzoyl peroxide 5 % external liquid; Wash affected areas once daily, Disp-227 g, R-11, Normal  3. Encounter for diabetic foot exam (HCC)    Return in about 4 months (around 06/01/2022), or if symptoms worsen or fail to improve, for DM Type II.       Subjective     SUBJECTIVE/OBJECTIVE:    HPI    Mr. Cole Kent, 36 year old African-American male, with previous medical history of DM type II, and hidradenitis.    Presents to the [facility] family medicine clinic today for follow-up on DM type II.    DM type II  A1c is in prediabetes range  A1c today is 5.7  Patient is taking metformin 500 daily  Patient does not complain of any symptoms at this time    Patient would also like a letter for work stating that he is bothered by the bright lights and that it hurts his eyes but he can function better with the lights dimmed.  Provided letter to patient.    Patient did not have any other acute concerns at this time.    Diabetic foot exam:   Left Foot:   Visual Exam: normal   Pulse DP: 2+ (normal)   Filament test: normal sensation     Right Foot:   Visual Exam: normal   Pulse DP: 2+ (normal)   Filament test: normal sensation     Review of Systems   Constitutional:  Negative for activity change, appetite change and fever.   HENT:  Negative for congestion and sore throat.    Eyes:  Negative for pain and visual disturbance.   Respiratory:  Negative for cough, shortness of breath and wheezing.    Cardiovascular:  Negative  for chest pain and leg swelling.   Gastrointestinal:  Negative for abdominal pain, constipation, diarrhea, nausea and vomiting.   Genitourinary:  Negative for difficulty urinating and dysuria.   Neurological:  Negative for syncope, weakness and headaches.        Objective   Physical Exam  Vitals and nursing note reviewed.   Constitutional:       General: He is not in acute distress.     Appearance: Normal appearance. He is not ill-appearing.   Cardiovascular:      Rate and Rhythm: Normal rate and regular rhythm.      Heart sounds: No murmur heard.  Pulmonary:      Effort: No respiratory distress.      Breath sounds: Normal breath sounds. No wheezing or rhonchi.   Abdominal:      Palpations: Abdomen is soft.      Tenderness: There is no abdominal tenderness. There is no guarding or rebound.   Musculoskeletal:         General: No tenderness.      Right lower leg: No edema.      Left lower leg: No edema.  Neurological:      Mental Status: He is alert.        An electronic signature was used to authenticate this note.    --Jasper Loser, MD

## 2022-02-27 NOTE — Progress Notes (Signed)
Progress note      Name: Rontavious Albright    DATE OF SERVICE: 03/01/2022  REFERRING PHYSICIAN: Chauncey Cruel Nofziger, ARNP    Problem list:   Patient Active Problem List    Diagnosis Date Noted    Cyclic vomiting syndrome [R11.15] 12/15/2021    Hematochezia [K92.1] 12/15/2021    Weight loss [R63.4] 12/15/2021    H/O acute renal failure [Z87.448] 12/15/2021    Anxiety and depression [F41.9, F32.A] 12/15/2021            I conducted this encounter from [facility] via secure, live, face-to-face video conference with the patient who was located at home, alone.  I reviewed the risks and benefits of telemedicine as pertinent to this visit and the patient agreed to proceed.  12/14/21 telemed:    HPI:  Mr. Shaler is a 36 year old male referred for evaluation of  cyclic vomiting syndrome.  He says his episodes of vomiting began during his teen years. They have always been episodic, most recently occurring every 3-4 months. They attacks have been stereotypical. They are provoked either when he is having a bowel movement or during or after he has just eaten a meal. There is no prodromal period. He suddenly experiences pain just below and to the left of his umbilicus and nausea. Vomiting begins within a minute. Between episodes he has felt normal. Episodes used to occur once a year but have progressively become more frequent. He has required ICU care for acute renal failure and was most recently hospitalized in March, 2023 with that complication.  He now periodically throws up during his "normal period" though that is infrequent. He had smoked marijuana for years but has not smoked in the past 2 months. He says he went a period of 10 years without smoking and still had the vomiting episodes.      He has lost 40# in the past year. He thinks this is because he goes for long periods when in an episode without eating. However, he also thinks he has been losing weight even when not sick with vomiting. He endorses having occasional  early satiety and does fairly frequently vomit old ingesta. That is most common if attacks start in the early AM when he will vomit undigested frood from his supper meal. He denies a history of peptic ulcer or H pylori. He denies NSAID use. Episodes have resulted in acute kidney failure from dehydration.     His bowel movements are either constipated or diarrhea, gooey, hard to wipe away. Has seen blood mixed in with stool on occasion over the past 2-3 years about the same time as his irregular bowel habit developed. He hs not had colonscopy.    Impressions:        (R11.15) Cyclic vomiting syndrome  Comment: This is possibly cyclic vomiting syndrome (CVS). The stereotypical onset of spells,  FH of migraine, normal intervening periods and lack of other identifiable organic or metabolic causes are all compatible. Abdominal pain is also a very frequent symptom in CVS. Cannabinoid hyperemesis syndrome (CHS) is not likely here based on the patients statement that during a 10 year period of abstinence he continued to have vomiting episodes. Gastroparesis is also a consideration as he endorses having both satiety and vomiting of old ingesta. He has not had a gastric emptying study to date. Functional nausea and vomiting syndrome is also unlikely based on the intervening normal periods. His esponse to hot bathing is also a supportive symptom seen  in both CVS. However, he states that cold water bathing can also be effective in relieving symptoms, a finding that is rare but has been reported in CVS.His report of irregular bowel habits and intermittent hematochezia need to be investigated as they could suggest another etiology for his symptoms. His progressive weight loss is also a concern. He does not think that episodes of vomiting are the root cause as he continues losing weight during periods when he isn't vomiting.        Plan:      1) Trial of  topiramate 25 MG at hs increasing weekly by 25 mg to 100 mg  2) Use Imitrex  spray immediately at onset of symptoms with head held level.  3) If Imitrex fails, trial of 16 mg ondansetron lingual.     4) Gastric emptying study         (K92.1) Hematochezia  (primary encounter diagnosis)  Comment: This has been present intermittently for 2-3 years.  Plan: Colonoscopy          (R63.4) Weight loss  Comment: As above.        Subjective: Mr.Sass is not had any CVS attacks. He had been having them monthly. It has now been been 7 months since he had an episode. He is maintained on On 100 mg topiramate daily.  His colonoscopy to evaluate hematochezia is scheduled in in November.     Objective  01/19/2022 NM GASTRIC EMPTYING: Normal gastric emptying. I have personally reviewed the images and agree with the report (or as edited).  Past studies:     07/07/19 EGD:  Normal esophagus.   Mild gastritis, no erosions.   Normal duodenum.        Path:   1.  Duodenum, biopsy:  -Duodenal mucosa with focal foveolar metaplasia.  -No significant inflammatory change or villous abnormalities noted.  -Negative for dysplasia or malignancy.    2.  Stomach, biopsy:  -Gastric mucosa with no significant pathologic abnormality.  -No Helicobacter organisms identified on routine staining.  -Negative for intestinal metaplasia, dysplasia or malignancy.     Porphobilinogen, Rand Ur   0.0 - 2.0 mg/L 0.      10/17/21 ABD CT wo contrast: No acute abdominal pathology.    10/17/21:     Q-T INTERVAL (CORRECTED)   ms 428          07/07/19 Head CT wo contrast: No intracranial abnormality evident.      CVS data base:  Age at onset: in teens  Age at diagnosis:       Spells:  # vomiting spells/month: every 2-3 months but more frequent recently  Duration of spells (hrs): 24-48 hours. The last two were longer and lasted 1 1/2 month this timeseveral weeks. They are becoming more severe. His last episode occurred a week ago.  Personal migraine history:  NO  Family migraine history: YES mother  Current asymptomatic intervals: YES  Marijuana use:  YES   None in 2 months. He didn't smoke for a period of 10 years and still had episodes                 Abdominal pain:  YES       If yes:          Location: peri-umbilical, a litlle below and to the left                        Severity:  starts mild as  fast symptom, can be severe        Onset: concurrent with vomiting                       Used narcotics for pain control: only in hospital                  Diarrhea with spell:  either that or constipated  Triggers: YES used to think fried food. None now       If yes: stress: Negative  But the effect is delayed up to a week  Response to hot bath or shower: YES  Relieves while in water. Cold water can also work.  Response to sleep               Termination: NO               Interval abdominal pain: NO  Interval nausea: NO  Current disability due to symptoms: YES     Prodrome: Probably not             Meds used:                                       Maximum dose                 Effect  Ondansetron    8 mg  No more +   Promethazine 50 mg pill -   Prochlorperazine -     Lorazepam 1 mg - Not now. Can cause seizures   clonazepam -     diphenhydramine 50 mg -   Metoclopramide  Imitrex  apepitant  Other -  Nasal spray  -       Lowered pain and nausea      Response to sleep: NO     Prophylactic Treatment: YES        Drug                                    Max dose                       Effect                                                                                                                         amitriptyline -     Nortriptyline    -     Propranolol 40 tid  1 year. For anxiety -   Topiramate -     Zonisamide -     Leviteracetam     scopalamine Taken a long time ago  for seizure. Had behavior changes    A little. But still to use hospital   Co-enzyme Q  L-carnitine     Other -  -  Testing:       Endoscopy:                      EGD: YES                          Colonosopy: NO          Current Outpatient Medications:     ARIPiprazole 15 MG tablet, Take 1 tablet  (15 mg) by mouth., Disp: , Rfl:     busPIRone 15 MG tablet, Take 1 tablet (15 mg) by mouth 4 times a day., Disp: , Rfl:     LORazepam 1 MG tablet, Take 1 tablet (1 mg) by mouth 2 times a day as needed., Disp: , Rfl:     ondansetron 8 MG disintegrating tablet, Dissolve 2 tablets (16 mg) on top of tongue and swallow 2 times a day as needed., Disp: , Rfl:     promethazine 50 MG tablet, Take 1 tablet (50 mg) by mouth every 6 hours as needed., Disp: , Rfl:     propranolol 20 MG tablet, Take by mouth., Disp: , Rfl:     SUMAtriptan 20 MG/ACT nasal spray, Spray 1 spray (20 mg) in the nostril., Disp: , Rfl:     topiramate 25 MG tablet, Take one tablet at night. Every week increase by one tablet to 4 tablets maximum., Disp: 100 tablet, Rfl: 1    traZODone 100 MG tablet, Take 1-2 tablets (100-200 mg) by mouth., Disp: , Rfl:           Review of patient's allergies indicates:  Allergies   Allergen Reactions    Cat Hair Extract Skin: Itching             Physical Exam: not done    Impression  (R11.15) Cyclic vomiting syndrome  (primary encounter diagnosis)  Comment: Mr. Bartolomei is CVS episode-free on 100 mg topiramate daily. I see no need to alter the dose.  Plan:   1) topiramate 100 MG tablet          2) Try to get on a wait list for earlier colonoscopy      Follow-up 4 months.  I spent a total of  for the patient's care on the date of the service.      Nicholes Calamity MD  Associate Clinical Professor  [employer]

## 2022-03-01 ENCOUNTER — Ambulatory Visit: Payer: No Typology Code available for payment source | Admitting: Gastroenterology

## 2022-03-01 DIAGNOSIS — R1115 Cyclical vomiting syndrome unrelated to migraine: Secondary | ICD-10-CM

## 2022-03-01 MED ORDER — TOPIRAMATE 100 MG OR TABS
ORAL_TABLET | ORAL | 1 refills | Status: AC
Start: 2022-03-01 — End: ?

## 2022-03-02 NOTE — Patient Instructions (Signed)
Hunter Parsons,    Per our conversation yesterday, I suggest:    Plan:   1) topiramate 100 MG tablet size prescribed          2) Try to get on a wait list for earlier colonoscopy. If, after a month you have no success, please let me know.      Please schedule a follow-up visit in 4 months.

## 2022-03-20 NOTE — Telephone Encounter (Signed)
Spoke with patient and he would like to be put on wait list to reschedule appointment . Due to provider being out of clinic 06/01/22

## 2022-03-22 MED ORDER — GOLYTELY 236 G OR SOLR
ORAL | 0 refills | Status: DC
Start: 2022-03-22 — End: 2022-06-22

## 2022-03-22 NOTE — Telephone Encounter (Addendum)
Colonoscopy schd @ [facility] on 06/26/2022 @ 1400 with Dr. Noah Delaine    Orders pended to MD.     Pre procedure instructions and appointment information sent on 03/22/2022 via [employer].  Text reminder sent.

## 2022-03-22 NOTE — Addendum Note (Signed)
Addended by: Lorna Dibble R on: 03/22/2022 03:51 PM     Modules accepted: Orders

## 2022-04-25 ENCOUNTER — Ambulatory Visit (INDEPENDENT_AMBULATORY_CARE_PROVIDER_SITE_OTHER): Payer: No Typology Code available for payment source | Admitting: Internal Medicine

## 2022-04-30 ENCOUNTER — Inpatient Hospital Stay
Admit: 2022-04-30 | Discharge: 2022-05-01 | Disposition: A | Discharge status: Home / Self Care | Attending: Emergency Medicine

## 2022-04-30 DIAGNOSIS — Z202 Contact with and (suspected) exposure to infections with a predominantly sexual mode of transmission: Secondary | ICD-10-CM

## 2022-04-30 NOTE — ED Provider Notes (Signed)
[facility]     Emergency Department     Faculty Attestation    I performed a history and physical examination of the patient and discussed management with the resident. I reviewed the resident's note and agree with the documented findings and plan of care. Any areas of disagreement are noted on the chart. I was personally present for the key portions of any procedures. I have documented in the chart those procedures where I was not present during the key portions. I have reviewed the emergency nurses triage note. I agree with the chief complaint, past medical history, past surgical history, allergies, medications, social and family history as documented unless otherwise noted below. For Physician Assistant/ Nurse Practitioner cases/documentation I have personally evaluated this patient and have completed at least one if not all key elements of the E/M (history, physical exam, and MDM). Additional findings are as noted.    Note Started: 8:58 PM EDT    Patient with concern for trichomoniasis partner tested positive also has not dysuria will treat plan discharge      Critical Care     none    Teryl Lucy, MD, [facility]  Attending Emergency  Physician           Teryl Lucy, MD  04/30/22 [phone]

## 2022-04-30 NOTE — ED Notes (Addendum)
Pt to ED for exposure to STD. PT states his partner tested positive for trich. Pt states he is having urinary frequency but denies any other symptoms. Patient alert and oriented x4, talking in complete sentences. Respirations even and unlabored. Call light in reach, all needs met at this time     Cristie Hem, RN  04/30/22 2017

## 2022-04-30 NOTE — Discharge Instructions (Signed)
Thank you for visiting [facility].    You need to call Jasper Loser, MD to make an appointment as directed for follow up.    Should you have any questions regarding your care or further treatment, please call [facility] at [phone].    Return to emergency department for any new or worrisome symptoms including any vomiting, fever, abdominal pain, penile discharge.  Please abstain from any sexual intercourse until your results from your additional testing are available.  Results may be available on MyChart within the next few days and you should follow-up with your family physician regarding the results.

## 2022-05-01 LAB — TRICHOMONAS VAGINALI, MOLECULAR: Trichomonas Vaginali, Molecular: NEGATIVE

## 2022-05-01 LAB — C.TRACHOMATIS N.GONORRHOEAE DNA, URINE
C. trachomatis DNA ,Urine: NEGATIVE
N. gonorrhoeae DNA, Urine: NEGATIVE

## 2022-05-01 MED ORDER — METRONIDAZOLE 500 MG PO TABS
500 | Freq: Once | ORAL | Status: AC
Start: 2022-05-01 — End: 2022-04-30
  Administered 2022-05-01: 01:00:00 2000 mg via ORAL

## 2022-05-01 MED FILL — METRONIDAZOLE 500 MG PO TABS: 500 MG | ORAL | Qty: 4

## 2022-05-03 ENCOUNTER — Encounter: Payer: MEDICAID | Attending: Dermatology | Primary: Student in an Organized Health Care Education/Training Program

## 2022-05-15 ENCOUNTER — Ambulatory Visit (INDEPENDENT_AMBULATORY_CARE_PROVIDER_SITE_OTHER): Payer: Self-pay | Admitting: Internal Medicine

## 2022-06-01 ENCOUNTER — Encounter
Payer: MEDICAID | Attending: Student in an Organized Health Care Education/Training Program | Primary: Student in an Organized Health Care Education/Training Program

## 2022-06-20 NOTE — Progress Notes (Signed)
Progress note      Name: Hunter Parsons    DATE OF SERVICE: 06/21/2022  REFERRING PHYSICIAN: none    Problem list:   Patient Active Problem List    Diagnosis Date Noted    Cyclic vomiting syndrome [R11.15] 12/15/2021    Hematochezia [K92.1] 12/15/2021    Weight loss [R63.4] 12/15/2021    H/O acute renal failure [Z87.448] 12/15/2021    Anxiety and depression [F41.9, F32.A] 12/15/2021            I conducted this encounter from my home via secure, live, face-to-face video conference with the patient who was located at home, alone.  I reviewed the risks and benefits of telemedicine as pertinent to this visit and the patient agreed to proceed.    03/01/22 telemed:   HunterParsons is not had any CVS attacks. He had been having them monthly. It has now been been 7 months since he had an episode. He is maintained on On 100 mg topiramate daily.  His colonoscopy to evaluate hematochezia is scheduled in in November.         Impression  (0000000) Cyclic vomiting syndrome  (primary encounter diagnosis)  Comment: Hunter Parsons is CVS episode-free on 100 mg topiramate daily. I see no need to alter the dose.  Plan:   1) topiramate 100 MG tablet          2) Try to get on a wait list for earlier colonoscopy        Follow-up 4 months.    Subjective: Hunter Parsons has colonoscopy scheduled next week.  He has had no attacks of CVS since I saw him last and no side effects of topiramate 100 mg daily. Last week he randomly "passed out" after waking and abruptly getting up off couch. That sounds like a one-off episode of orthostatic hypotension.      Objective  01/19/2022 NM GASTRIC EMPTYING: Normal gastric emptying.   Past studies:     07/07/19 EGD:  Normal esophagus.   Mild gastritis, no erosions.   Normal duodenum.        Path:   1.  Duodenum, biopsy:  -Duodenal mucosa with focal foveolar metaplasia.  -No significant inflammatory change or villous abnormalities noted.  -Negative for dysplasia or malignancy.    2.  Stomach, biopsy:  -Gastric mucosa  with no significant pathologic abnormality.  -No Helicobacter organisms identified on routine staining.  -Negative for intestinal metaplasia, dysplasia or malignancy.     Porphobilinogen, Rand Ur   0.0 - 2.0 mg/L 0.      10/17/21 ABD CT wo contrast: No acute abdominal pathology.    10/17/21:     Q-T INTERVAL (CORRECTED)   ms 428          07/07/19 Head CT wo contrast: No intracranial abnormality evident.      CVS data base:  Age at onset: in teens  Age at diagnosis:       Spells:  # vomiting spells/month: every 2-3 months but more frequent recently  Duration of spells (hrs): 24-48 hours. The last two were longer and lasted 1 1/2 month this timeseveral weeks. They are becoming more severe. His last episode occurred a week ago.  Personal migraine history:  NO  Family migraine history: YES mother  Current asymptomatic intervals: YES  Marijuana use:  YES  None in 2 months. He didn't smoke for a period of 10 years and still had episodes  Abdominal pain:  YES       If yes:          Location: peri-umbilical, a litlle below and to the left                        Severity:  starts mild as fast symptom, can be severe        Onset: concurrent with vomiting                       Used narcotics for pain control: only in hospital                  Diarrhea with spell:  either that or constipated  Triggers: YES used to think fried food. None now       If yes: stress: Negative  But the effect is delayed up to a week  Response to hot bath or shower: YES  Relieves while in water. Cold water can also work.  Response to sleep               Termination: NO               Interval abdominal pain: NO  Interval nausea: NO  Current disability due to symptoms: YES     Prodrome: Probably not             Meds used:                                       Maximum dose                 Effect  Ondansetron    8 mg  No more +   Promethazine 50 mg pill -   Prochlorperazine -     Lorazepam 1 mg - Not now. Can cause seizures   clonazepam -      diphenhydramine 50 mg -   Metoclopramide  Imitrex  apepitant  Other -  Nasal spray  -       Lowered pain and nausea      Response to sleep: NO     Prophylactic Treatment: YES        Drug                                    Max dose                       Effect                                                                                                                         amitriptyline -     Nortriptyline    -     Propranolol 40 tid  1 year. For anxiety -   Topiramate -     Zonisamide -     Leviteracetam     scopalamine Taken a long time ago  for seizure. Had behavior changes    A little. But still to use hospital   Co-enzyme Q  L-carnitine     Other -  -        Testing:       Endoscopy:                      EGD: YES                          Colonosopy: NO        Current Outpatient Medications:     ARIPiprazole 15 MG tablet, Take 1 tablet (15 mg) by mouth., Disp: , Rfl:     busPIRone 15 MG tablet, Take 1 tablet (15 mg) by mouth 4 times a day., Disp: , Rfl:     LORazepam 1 MG tablet, Take 1 tablet (1 mg) by mouth 2 times a day as needed., Disp: , Rfl:     ondansetron 8 MG disintegrating tablet, Dissolve 2 tablets (16 mg) on top of tongue and swallow 2 times a day as needed., Disp: , Rfl:     promethazine 50 MG tablet, Take 1 tablet (50 mg) by mouth every 6 hours as needed., Disp: , Rfl:     propranolol 20 MG tablet, Take by mouth., Disp: , Rfl:     SUMAtriptan 20 MG/ACT nasal spray, Spray 1 spray (20 mg) in the nostril., Disp: , Rfl:     topiramate 100 MG tablet, Take one tablet at bedtime., Disp: 90 tablet, Rfl: 1    topiramate 25 MG tablet, Take one tablet at night. Every week increase by one tablet to 4 tablets maximum., Disp: 100 tablet, Rfl: 1    traZODone 100 MG tablet, Take 1-2 tablets (100-200 mg) by mouth., Disp: , Rfl:           Review of patient's allergies indicates:  Allergies   Allergen Reactions    Cat Hair Extract Skin: Itching       No results found.      Physical  Exam:      Impression:  (R11.15) Cyclic vomiting syndrome  (primary encounter diagnosis)  Comment: Hunter Parsons' CVS is completely controlled on 100 mg topiramate daily. He had one episode of likely orthostatic hypotension.  Plan:  1) No change in topiramate 100 mg daily  2) He will let me know if he has another syncope attack  3) He is to obtain a PCP. He does not have one now.    Follow-up appointment in 6 months     I spent a total of  12   minutes for the patient's care on the date of the service.      Nicholes Calamity MD  Associate Clinical Professor  [employer]

## 2022-06-21 ENCOUNTER — Ambulatory Visit: Payer: Commercial Managed Care - HMO | Admitting: Gastroenterology

## 2022-06-21 DIAGNOSIS — R1115 Cyclical vomiting syndrome unrelated to migraine: Secondary | ICD-10-CM

## 2022-06-22 NOTE — Patient Instructions (Addendum)
Hunter Parsons,    I'm glad you are doing so well at this time.    Per our conversation yesterday, I suggest:    1) No change in topiramate 100 mg daily    2) Please  let me know if you have another fainting attack    3) Please do sign up with a primary care provider!  It is important to have one.    Please schedule a follow-up appointment in 6 months

## 2022-06-25 ENCOUNTER — Encounter (INDEPENDENT_AMBULATORY_CARE_PROVIDER_SITE_OTHER): Payer: Self-pay | Admitting: Gastroenterology

## 2022-06-26 ENCOUNTER — Ambulatory Visit (HOSPITAL_BASED_OUTPATIENT_CLINIC_OR_DEPARTMENT_OTHER): Payer: Commercial Managed Care - HMO | Admitting: Gastroenterology

## 2023-03-31 ENCOUNTER — Encounter

## 2023-04-01 NOTE — Telephone Encounter (Signed)
Please schedule appt for refills

## 2023-07-01 ENCOUNTER — Ambulatory Visit: Admit: 2023-07-01 | Payer: MEDICAID

## 2023-07-01 ENCOUNTER — Inpatient Hospital Stay: Disposition: A | Discharge status: Home / Self Care | Payer: Medicaid (Managed Care) | Admitting: Family Medicine

## 2023-07-01 VITALS — BP 135/82 | HR 72 | Ht 69.02 in | Wt 242.2 lb

## 2023-07-01 DIAGNOSIS — I10 Essential (primary) hypertension: Secondary | ICD-10-CM

## 2023-07-01 LAB — BASIC METABOLIC PANEL
Anion Gap: 9 mmol/L (ref 9–16)
BUN: 11 mg/dL (ref 6–20)
CO2: 27 mmol/L (ref 20–31)
Calcium: 9.4 mg/dL (ref 8.6–10.4)
Chloride: 104 mmol/L (ref 98–107)
Creatinine: 1.3 mg/dL — ABNORMAL HIGH (ref 0.7–1.2)
Est, Glom Filt Rate: 73 mL/min/{1.73_m2} (ref >60)
Glucose: 99 mg/dL (ref 74–99)
Potassium: 4.2 mmol/L (ref 3.7–5.3)
Sodium: 140 mmol/L (ref 136–145)

## 2023-07-01 LAB — POCT GLYCOSYLATED HEMOGLOBIN (HGB A1C): Hemoglobin A1C: 5.8 %

## 2023-07-01 LAB — MICROALBUMIN, UR
Creatinine, Ur: 259 mg/dL (ref 39.0–259.0)
Microalb, Ur: <12 mg/L (ref 0–20)

## 2023-07-01 MED ORDER — METFORMIN HCL 500 MG PO TABS
500 | ORAL_TABLET | Freq: Every day | ORAL | 1 refills | Status: AC
Start: 2023-07-01 — End: ?

## 2023-07-01 MED ORDER — IMIQUIMOD 5 % EX CREA
5 | CUTANEOUS | 2 refills | Status: AC
Start: 2023-07-01 — End: 2023-07-08

## 2023-07-01 MED ORDER — BENZOYL PEROXIDE 5 % EX LIQD
5 | CUTANEOUS | 3 refills | Status: AC
Start: 2023-07-01 — End: ?

## 2023-07-01 MED ORDER — HYDROCHLOROTHIAZIDE 25 MG PO TABS
25 | ORAL_TABLET | Freq: Every day | ORAL | 5 refills | Status: AC
Start: 2023-07-01 — End: ?

## 2023-07-01 MED ORDER — CLINDAMYCIN PHOSPHATE 1 % EX LOTN
1 | CUTANEOUS | 3 refills | Status: AC
Start: 2023-07-01 — End: ?

## 2023-07-01 NOTE — Patient Instructions (Addendum)
 Thank you for letting us take care of you today. We hope all your questions were addressed. If a question was overlooked or something else comes to mind after you return home, please contact a member of your Care Team listed below.        Your Care Team at

## 2023-07-01 NOTE — Progress Notes (Signed)
 Attending Physician Statement  I have discussed the care of Cole Kent, including pertinent history and exam findings,  with the resident. I have seen and examined the patient and the key elements of all parts of the encounter have been performed by

## 2023-07-01 NOTE — Progress Notes (Signed)
 [facility]    Family Medicine Residency Program - Outpatient Note      Subjective:    Cole Kent is a 37 y.o. male with  has no past medical history on file.    Presented to the office today for:  Chief Complaint   Patient presents

## 2023-07-01 NOTE — Progress Notes (Signed)
 DIABETES and HYPERTENSION visit    BP Readings from Last 3 Encounters:   04/30/22 (!) 157/103   01/30/22 127/84   10/11/20 127/79        Hemoglobin A1C (%)   Date Value   01/30/2022 5.7   10/11/2020 5.6   04/08/2020 5.9     HDL (mg/dL)   Date Value   78/29

## 2024-01-13 ENCOUNTER — Ambulatory Visit: Admit: 2024-01-13 | Discharge: 2024-01-13 | Payer: Medicaid (Managed Care)

## 2024-01-13 VITALS — BP 129/84 | HR 95 | Temp 97.80000°F | Resp 17 | Ht 69.0 in | Wt 253.0 lb

## 2024-01-13 DIAGNOSIS — L739 Follicular disorder, unspecified: Secondary | ICD-10-CM

## 2024-01-13 LAB — POCT URINALYSIS DIPSTICK
Bilirubin, UA: NEGATIVE
Blood, UA POC: NEGATIVE
Glucose, UA POC: NEGATIVE mg/dL
Leukocytes, UA: NEGATIVE
Nitrite, UA: NEGATIVE
Spec Grav, UA: 10.1
Urobilinogen, UA: NEGATIVE mg/dL
pH, UA: 5

## 2024-01-13 MED ORDER — CEFTRIAXONE SODIUM 500 MG IJ SOLR
500 | Freq: Once | INTRAMUSCULAR | Status: AC
Start: 2024-01-13 — End: 2024-01-13
  Administered 2024-01-13: 20:00:00 500 mg via INTRAMUSCULAR

## 2024-01-13 MED ORDER — DOXYCYCLINE HYCLATE 100 MG PO TABS
100 | ORAL_TABLET | Freq: Two times a day (BID) | ORAL | 0 refills | 7.00000 days | Status: AC
Start: 2024-01-13 — End: 2024-01-23

## 2024-01-13 NOTE — Progress Notes (Signed)
 Chief complaint(s): Sexually Transmitted Diseases (Boil on butt & in between thigh this time for 3 months. )    Cole Kent (DOB: 1985/12/17) is a 38 y.o. male, Established patient, here for evaluation of the following   History provided by:  Patient  Language interpreter used: No    Sexually Transmitted Diseases  The patient's primary symptoms include genital itching and penile discharge. This is a new problem. The current episode started in the past 7 days. The problem has been gradually worsening. Associated symptoms include dysuria. Pertinent negatives include no constipation, discolored urine, flank pain, hematuria, joint pain, joint swelling, painful intercourse, shortness of breath, sore throat, urinary retention or vomiting. The penile discharge was Yellow. He is sexually active with multiple partners. He inconsistently uses condoms. It is possible that his partner has an STD.     PAST MEDICAL HISTORY    History reviewed. No pertinent past medical history.    SURGICAL HISTORY    History reviewed. No pertinent surgical history.    CURRENT MEDICATIONS    Current Outpatient Rx   Medication Sig Dispense Refill    doxycycline  hyclate (VIBRA -TABS) 100 MG tablet Take 1 tablet by mouth 2 times daily for 10 days 20 tablet 0    hydroCHLOROthiazide  (HYDRODIURIL ) 25 MG tablet Take 1 tablet by mouth daily (Patient not taking: Reported on 01/13/2024) 30 tablet 5    metFORMIN  (GLUCOPHAGE ) 500 MG tablet Take 1 tablet by mouth daily (with breakfast) (Patient not taking: Reported on 01/13/2024) 180 tablet 1    benzoyl peroxide  5 % external liquid Wash affected areas once daily (Patient not taking: Reported on 01/13/2024) 227 g 3    clindamycin  (CLEOCIN  T) 1 % lotion Apply to affected areas daily (Patient not taking: Reported on 01/13/2024) 60 mL 3    acetaminophen  (TYLENOL ) 500 MG tablet Take 2 tablets by mouth 3 times daily as needed for Pain (Patient not taking: Reported on 01/13/2024) 180 tablet 0    Emollient (CERAVE) CREA  Apply twice daily as moisturizer to full skin (Patient not taking: Reported on 01/13/2024) 453 g 2    doxycycline  hyclate (VIBRAMYCIN ) 100 MG capsule Take 1 pill twice daily with food (Patient not taking: Reported on 01/13/2024) 60 capsule 2       ALLERGIES    No Known Allergies    FAMILY HISTORY    History reviewed. No pertinent family history.    SOCIAL HISTORY    Social History     Socioeconomic History    Marital status: Single     Spouse name: None    Number of children: None    Years of education: None    Highest education level: None   Tobacco Use    Smoking status: Never    Smokeless tobacco: Never   Substance and Sexual Activity    Alcohol use: Never    Drug use: Yes     Types: Marijuana Cole Kent)     Social Drivers of Psychologist, prison and probation services Strain: Low Risk  (07/01/2023)    Overall Financial Resource Strain (CARDIA)     Difficulty of Paying Living Expenses: Not hard at all   Food Insecurity: No Food Insecurity (07/01/2023)    Hunger Vital Sign     Worried About Running Out of Food in the Last Year: Never true     Ran Out of Food in the Last Year: Never true   Transportation Needs: Unknown (07/01/2023)    PRAPARE - Transportation  Lack of Transportation (Non-Medical): No   Housing Stability: Unknown (07/01/2023)    Housing Stability Vital Sign     Homeless in the Last Year: No       Vitals:    01/13/24 1503   BP: 129/84   BP Site: Right Upper Arm   Patient Position: Sitting   Pulse: 95   Resp: 17   Temp: 97.8 F (36.6 C)   TempSrc: Oral   Weight: 114.8 kg (253 lb)   Height: 1.753 m (5\' 9" )       Review of Systems   Constitutional: Negative.    HENT: Negative.  Negative for sore throat.    Eyes: Negative.    Respiratory: Negative.  Negative for shortness of breath.    Cardiovascular: Negative.    Gastrointestinal: Negative.  Negative for constipation and vomiting.   Genitourinary:  Positive for dysuria and penile discharge. Negative for flank pain.   Musculoskeletal: Negative.  Negative for joint pain.    Skin:  Positive for wound.        Infected boils on skin   Neurological: Negative.    Psychiatric/Behavioral: Negative.     All other systems reviewed and are negative.      Physical Exam  Vitals and nursing note reviewed.   Constitutional:       Appearance: Normal appearance.   HENT:      Head: Normocephalic and atraumatic.      Nose: Nose normal.      Mouth/Throat:      Mouth: Mucous membranes are moist.   Eyes:      Extraocular Movements: Extraocular movements intact.      Conjunctiva/sclera: Conjunctivae normal.      Pupils: Pupils are equal, round, and reactive to light.   Cardiovascular:      Pulses: Normal pulses.      Heart sounds: Normal heart sounds.   Pulmonary:      Effort: Pulmonary effort is normal.      Breath sounds: Normal breath sounds.   Abdominal:      General: Abdomen is flat.      Palpations: Abdomen is soft.   Musculoskeletal:         General: Normal range of motion.      Cervical back: Normal range of motion and neck supple.   Skin:     General: Skin is warm.      Findings: Abscess and erythema present.             Comments: Consistent with folliculitis   Neurological:      General: No focal deficit present.      Mental Status: He is alert and oriented to person, place, and time.   Psychiatric:         Mood and Affect: Mood normal.         Behavior: Behavior normal.         Results for orders placed or performed in visit on 01/13/24   POCT Urinalysis Dipstick no Micro   Result Value Ref Range    Color, UA yellow     Clarity, UA clear     Glucose, UA POC neg mg/dL    Bilirubin, UA neg     Ketones, UA trace mg/dL    Spec Grav, UA 16.10     Blood, UA POC neg     pH, UA 5.0     Protein, UA POC trace mg/dL    Urobilinogen, UA neg mg/dL    Leukocytes,  UA neg     Nitrite, UA neg        No results found.     ASSESSMENT/PLAN:  Cole Kent (DOB: 1985-11-04) is a 38 y.o. male with The primary encounter diagnosis was Folliculitis. A diagnosis of STD exposure was also pertinent to this visit..     Diagnosis Orders   1. Folliculitis        2. STD exposure  cefTRIAXone  (ROCEPHIN ) injection 500 mg    doxycycline  hyclate (VIBRA -TABS) 100 MG tablet    Urinary Tract Infection + High Risk Sexual Behavior ([facility])    POCT Urinalysis Dipstick no Micro        Based on the patient's current clinical status, the patient can be safely discharged home on treatment with close follow-up with PCP if symptoms do not improve as instructed.  Patient received ceftriaxone  IM injection that he tolerated very well with no side effects  Diagnosis, physical exam findings and medications discussed with patient in details including medication side effects.    Discharge instructions:   - Drink enough amount of fluids throughout the day to prevent dehydration  - Rest. Advance activities and diet as tolerated   - Take the prescribed medications as instructed.  - Tylenol  / Motrin  as needed for pain and fever   Patient was instructed to return for re evaluation or go to the ER if symptoms  worsen, or any other concerns . Patient verbalized understanding    An electronic signature was used to authenticate this note.    --Armenta Bernheim, PA-C MSBS

## 2024-09-15 ENCOUNTER — Ambulatory Visit: Payer: Medicaid (Managed Care)
